# Patient Record
Sex: Male | Born: 1950 | Race: White | Hispanic: No | Marital: Single | Smoking: Current every day smoker
Health system: Southern US, Community
[De-identification: ages and names within clinical notes are randomized; demographics above are authoritative.]

## PROBLEM LIST (undated history)

## (undated) DIAGNOSIS — F0390 Unspecified dementia without behavioral disturbance: Secondary | ICD-10-CM

## (undated) DIAGNOSIS — I10 Essential (primary) hypertension: Secondary | ICD-10-CM

## (undated) DIAGNOSIS — M542 Cervicalgia: Secondary | ICD-10-CM

## (undated) DIAGNOSIS — Z7901 Long term (current) use of anticoagulants: Secondary | ICD-10-CM

## (undated) DIAGNOSIS — R972 Elevated prostate specific antigen [PSA]: Secondary | ICD-10-CM

## (undated) DIAGNOSIS — L0100 Impetigo, unspecified: Secondary | ICD-10-CM

## (undated) DIAGNOSIS — R6889 Other general symptoms and signs: Secondary | ICD-10-CM

## (undated) DIAGNOSIS — F32A Depression, unspecified: Secondary | ICD-10-CM

## (undated) HISTORY — DX: Unspecified dementia, unspecified severity, without behavioral disturbance, psychotic disturbance, mood disturbance, and anxiety: F03.90

## (undated) HISTORY — DX: Essential (primary) hypertension: I10

## (undated) HISTORY — DX: Long term (current) use of anticoagulants: Z79.01

## (undated) HISTORY — PX: HERNIA REPAIR: SHX51

---

## 2000-03-26 ENCOUNTER — Ambulatory Visit (HOSPITAL_COMMUNITY): Admission: RE | Admit: 2000-03-26 | Discharge: 2000-03-26 | Payer: Self-pay | Admitting: Emergency Medicine

## 2000-03-26 ENCOUNTER — Encounter: Payer: Self-pay | Admitting: Emergency Medicine

## 2001-04-10 ENCOUNTER — Encounter: Payer: Self-pay | Admitting: Orthopedic Surgery

## 2001-04-10 ENCOUNTER — Ambulatory Visit (HOSPITAL_COMMUNITY): Admission: RE | Admit: 2001-04-10 | Discharge: 2001-04-10 | Payer: Self-pay | Admitting: Orthopedic Surgery

## 2004-10-24 ENCOUNTER — Ambulatory Visit (HOSPITAL_COMMUNITY): Admission: RE | Admit: 2004-10-24 | Discharge: 2004-10-24 | Payer: Self-pay | Admitting: Surgery

## 2004-10-24 ENCOUNTER — Ambulatory Visit (HOSPITAL_BASED_OUTPATIENT_CLINIC_OR_DEPARTMENT_OTHER): Admission: RE | Admit: 2004-10-24 | Discharge: 2004-10-24 | Payer: Self-pay | Admitting: Surgery

## 2005-12-02 ENCOUNTER — Ambulatory Visit (HOSPITAL_COMMUNITY): Admission: RE | Admit: 2005-12-02 | Discharge: 2005-12-02 | Payer: Self-pay | Admitting: Orthopedic Surgery

## 2007-08-18 ENCOUNTER — Emergency Department (HOSPITAL_COMMUNITY): Admission: EM | Admit: 2007-08-18 | Discharge: 2007-08-18 | Payer: Self-pay | Admitting: Emergency Medicine

## 2008-03-16 ENCOUNTER — Ambulatory Visit (HOSPITAL_COMMUNITY): Admission: RE | Admit: 2008-03-16 | Discharge: 2008-03-16 | Payer: Self-pay | Admitting: Urology

## 2009-05-28 ENCOUNTER — Emergency Department (HOSPITAL_COMMUNITY): Admission: EM | Admit: 2009-05-28 | Discharge: 2009-05-28 | Payer: Self-pay | Admitting: Emergency Medicine

## 2010-06-17 NOTE — Op Note (Signed)
NAME:  Gabriel Wilson, Gabriel Wilson NO.:  [id number]   MEDICAL RECORD NO.:  [id number]          PATIENT TYPE:  AMB   LOCATION:  [facility]                         FACILITY:  [facility]   PHYSICIAN:  Thornton Park. Daphine Deutscher, MD  DATE OF BIRTH:  Sep 12, 1950   DATE OF PROCEDURE:  10/24/2004  DATE OF DISCHARGE:                                 OPERATIVE REPORT   PREOPERATIVE DIAGNOSIS:  Right inguinal hernia.   POSTOPERATIVE DIAGNOSIS:  Right indirect inguinal hernia.   PROCEDURE:  Right inguinal herniorrhaphy with Prolene hernia system mesh by  Ethicon.   SURGEON:  Thornton Park. Daphine Deutscher, MD   ANESTHESIA:  General.   DESCRIPTION OF PROCEDURE:  Mr. Goh was taken to room four at [facility]  and given general anesthesia. The abdomen was prepped widely with Betadine  including his scrotum, penis and the right inguinal region with Betadine,  draped sterilely. The skin was marked and a small oblique incision was made,  carried down to the external oblique which was incised along the fibers. I  mobilized the cord and put a Penrose around it. The cord was big and I went  ahead and went proximally and opened the cremasteric fibers longitudinally  and dissected free a fairly prominent indirect sac. I opened the sac, put my  finger in, the floor and felt adjacent lipoma coming up further weakening  the ring. The ring was about the size of a quarter and I went ahead and did  a high ligation sac with a 2-0 silk and produced that. I inserted some 4x4  gauze to help me dissect the preperitoneal space and then I inserted a  hernia patch plug by Ethicon trimming the inside portion a bit and tucking  it inside. Outside I opened along medially to allow it to go around the cord  structures and I sutured it to itself with a 2-0 Prolene and then tacked it  inferiorly to the inguinal ligament. It was tacked superiorly around the  ring with two sutures of 2-0 Prolene. It was then tucked beneath the  external oblique  and the external oblique was then closed over it with a  running 2-0 Vicryl. The area had been previously injected with 0.5%  Marcaine. No ilioinguinal nerve branches appeared to be transected and were  intact. The wound was then closed in layers with 4-0 Vicryl and with a  running subcuticular 5-0 Vicryl with Benzoin and Steri-Strips. The patient  was given a prescription for Tylox (#30) to take for pain. He will be  followed-up in the office in approximately 3 weeks.      Thornton Park Daphine Deutscher, MD  Electronically Signed     MBM/MEDQ  D:  10/24/2004  T:  10/24/2004  Job:  [id number]   cc:   Brett Canales A. Cleta Alberts, M.D.  Fax: [phone]

## 2011-03-22 ENCOUNTER — Ambulatory Visit: Payer: Self-pay | Admitting: Family Medicine

## 2011-03-22 VITALS — BP 182/98 | HR 80 | Temp 98.4°F | Resp 18 | Ht 74.0 in | Wt 245.0 lb

## 2011-03-22 DIAGNOSIS — I10 Essential (primary) hypertension: Secondary | ICD-10-CM

## 2011-03-22 DIAGNOSIS — R05 Cough: Secondary | ICD-10-CM

## 2011-03-22 DIAGNOSIS — R059 Cough, unspecified: Secondary | ICD-10-CM

## 2011-03-22 DIAGNOSIS — E669 Obesity, unspecified: Secondary | ICD-10-CM | POA: Insufficient documentation

## 2011-03-22 DIAGNOSIS — J029 Acute pharyngitis, unspecified: Secondary | ICD-10-CM

## 2011-03-22 MED ORDER — HYDROCODONE-ACETAMINOPHEN 5-500 MG PO TABS: 1.0000 | ORAL_TABLET | Freq: Three times a day (TID) | ORAL | Status: AC | PRN

## 2011-03-22 MED ORDER — HYDROCHLOROTHIAZIDE 25 MG PO TABS
25.0000 mg | ORAL_TABLET | Freq: Every day | ORAL | Status: DC
Start: 2011-03-22 — End: 2023-06-12

## 2011-03-22 MED ORDER — AZITHROMYCIN 250 MG PO TABS: ORAL_TABLET | ORAL | Status: AC

## 2011-03-22 NOTE — Progress Notes (Signed)
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 Low-grade temperature, chills, and aches.  Objective: Overweight middle-aged man in no acute distress  Patient is alert, cooperative and has no shortness of breath.  HEENT: Very red posterior pharynx, normal TMs.  Neck: No adenopathy, no thyromegaly, supple  Chest: Diffuse expiratory wheezes  Heart: Regular no murmur or gallop  Assessment: Bronchitis with pharyngitis  Plan: Z-Pak, Vicodin every 6 when necessary  Patient told to followup in 48 hours if not significantly better

## 2013-12-23 ENCOUNTER — Ambulatory Visit (INDEPENDENT_AMBULATORY_CARE_PROVIDER_SITE_OTHER): Payer: Self-pay | Admitting: Family Medicine

## 2013-12-23 VITALS — BP 148/88 | HR 70 | Temp 98.0°F | Resp 16 | Ht 73.0 in | Wt 221.4 lb

## 2013-12-23 DIAGNOSIS — M542 Cervicalgia: Secondary | ICD-10-CM

## 2013-12-23 DIAGNOSIS — L0100 Impetigo, unspecified: Secondary | ICD-10-CM

## 2013-12-23 DIAGNOSIS — S060X0A Concussion without loss of consciousness, initial encounter: Secondary | ICD-10-CM

## 2013-12-23 DIAGNOSIS — R6889 Other general symptoms and signs: Secondary | ICD-10-CM

## 2013-12-23 NOTE — Patient Instructions (Signed)
Concussion  A concussion, or closed-head injury, is a brain injury caused by a direct blow to the head or by a quick and sudden movement (jolt) of the head or neck. Concussions are usually not life-threatening. Even so, the effects of a concussion can be serious. If you have had a concussion before, you are more likely to experience concussion-like symptoms after a direct blow to the head.   CAUSES  · Direct blow to the head, such as from running into another player during a soccer game, being hit in a fight, or hitting your head on a hard surface.  · A jolt of the head or neck that causes the brain to move back and forth inside the skull, such as in a car crash.  SIGNS AND SYMPTOMS  The signs of a concussion can be hard to notice. Early on, they may be missed by you, family members, and health care providers. You may look fine but act or feel differently.  Symptoms are usually temporary, but they may last for days, weeks, or even longer. Some symptoms may appear right away while others may not show up for hours or days. Every head injury is different. Symptoms include:  · Mild to moderate headaches that will not go away.  · A feeling of pressure inside your head.  · Having more trouble than usual:  ¨ Learning or remembering things you have heard.  ¨ Answering questions.  ¨ Paying attention or concentrating.  ¨ Organizing daily tasks.  ¨ Making decisions and solving problems.  · Slowness in thinking, acting or reacting, speaking, or reading.  · Getting lost or being easily confused.  · Feeling tired all the time or lacking energy (fatigued).  · Feeling drowsy.  · Sleep disturbances.  ¨ Sleeping more than usual.  ¨ Sleeping less than usual.  ¨ Trouble falling asleep.  ¨ Trouble sleeping (insomnia).  · Loss of balance or feeling lightheaded or dizzy.  · Nausea or vomiting.  · Numbness or tingling.  · Increased sensitivity to:  ¨ Sounds.  ¨ Lights.  ¨ Distractions.  · Vision problems or eyes that tire  easily.  · Diminished sense of taste or smell.  · Ringing in the ears.  · Mood changes such as feeling sad or anxious.  · Becoming easily irritated or angry for little or no reason.  · Lack of motivation.  · Seeing or hearing things other people do not see or hear (hallucinations).  DIAGNOSIS  Your health care provider can usually diagnose a concussion based on a description of your injury and symptoms. He or she will ask whether you passed out (lost consciousness) and whether you are having trouble remembering events that happened right before and during your injury.  Your evaluation might include:  · A brain scan to look for signs of injury to the brain. Even if the test shows no injury, you may still have a concussion.  · Blood tests to be sure other problems are not present.  TREATMENT  · Concussions are usually treated in an emergency department, in urgent care, or at a clinic. You may need to stay in the hospital overnight for further treatment.  · Tell your health care provider if you are taking any medicines, including prescription medicines, over-the-counter medicines, and natural remedies. Some medicines, such as blood thinners (anticoagulants) and aspirin, may increase the chance of complications. Also tell your health care provider whether you have had alcohol or are taking illegal drugs. This information   may affect treatment.  · Your health care provider will send you home with important instructions to follow.  · How fast you will recover from a concussion depends on many factors. These factors include how severe your concussion is, what part of your brain was injured, your age, and how healthy you were before the concussion.  · Most people with mild injuries recover fully. Recovery can take time. In general, recovery is slower in older persons. Also, persons who have had a concussion in the past or have other medical problems may find that it takes longer to recover from their current injury.  HOME  CARE INSTRUCTIONS  General Instructions  · Carefully follow the directions your health care provider gave you.  · Only take over-the-counter or prescription medicines for pain, discomfort, or fever as directed by your health care provider.  · Take only those medicines that your health care provider has approved.  · Do not drink alcohol until your health care provider says you are well enough to do so. Alcohol and certain other drugs may slow your recovery and can put you at risk of further injury.  · If it is harder than usual to remember things, write them down.  · If you are easily distracted, try to do one thing at a time. For example, do not try to watch TV while fixing dinner.  · Talk with family members or close friends when making important decisions.  · Keep all follow-up appointments. Repeated evaluation of your symptoms is recommended for your recovery.  · Watch your symptoms and tell others to do the same. Complications sometimes occur after a concussion. Older adults with a brain injury may have a higher risk of serious complications, such as a blood clot on the brain.  · Tell your teachers, school nurse, school counselor, coach, athletic trainer, or work manager about your injury, symptoms, and restrictions. Tell them about what you can or cannot do. They should watch for:  ¨ Increased problems with attention or concentration.  ¨ Increased difficulty remembering or learning new information.  ¨ Increased time needed to complete tasks or assignments.  ¨ Increased irritability or decreased ability to cope with stress.  ¨ Increased symptoms.  · Rest. Rest helps the brain to heal. Make sure you:  ¨ Get plenty of sleep at night. Avoid staying up late at night.  ¨ Keep the same bedtime hours on weekends and weekdays.  ¨ Rest during the day. Take daytime naps or rest breaks when you feel tired.  · Limit activities that require a lot of thought or concentration. These include:  ¨ Doing homework or job-related  work.  ¨ Watching TV.  ¨ Working on the computer.  · Avoid any situation where there is potential for another head injury (football, hockey, soccer, basketball, martial arts, downhill snow sports and horseback riding). Your condition will get worse every time you experience a concussion. You should avoid these activities until you are evaluated by the appropriate follow-up health care providers.  Returning To Your Regular Activities  You will need to return to your normal activities slowly, not all at once. You must give your body and brain enough time for recovery.  · Do not return to sports or other athletic activities until your health care provider tells you it is safe to do so.  · Ask your health care provider when you can drive, ride a bicycle, or operate heavy machinery. Your ability to react may be slower after a   brain injury. Never do these activities if you are dizzy.  · Ask your health care provider about when you can return to work or school.  Preventing Another Concussion  It is very important to avoid another brain injury, especially before you have recovered. In rare cases, another injury can lead to permanent brain damage, brain swelling, or death. The risk of this is greatest during the first 7-10 days after a head injury. Avoid injuries by:  · Wearing a seat belt when riding in a car.  · Drinking alcohol only in moderation.  · Wearing a helmet when biking, skiing, skateboarding, skating, or doing similar activities.  · Avoiding activities that could lead to a second concussion, such as contact or recreational sports, until your health care provider says it is okay.  · Taking safety measures in your home.  ¨ Remove clutter and tripping hazards from floors and stairways.  ¨ Use grab bars in bathrooms and handrails by stairs.  ¨ Place non-slip mats on floors and in bathtubs.  ¨ Improve lighting in dim areas.  SEEK MEDICAL CARE IF:  · You have increased problems paying attention or  concentrating.  · You have increased difficulty remembering or learning new information.  · You need more time to complete tasks or assignments than before.  · You have increased irritability or decreased ability to cope with stress.  · You have more symptoms than before.  Seek medical care if you have any of the following symptoms for more than 2 weeks after your injury:  · Lasting (chronic) headaches.  · Dizziness or balance problems.  · Nausea.  · Vision problems.  · Increased sensitivity to noise or light.  · Depression or mood swings.  · Anxiety or irritability.  · Memory problems.  · Difficulty concentrating or paying attention.  · Sleep problems.  · Feeling tired all the time.  SEEK IMMEDIATE MEDICAL CARE IF:  · You have severe or worsening headaches. These may be a sign of a blood clot in the brain.  · You have weakness (even if only in one hand, leg, or part of the face).  · You have numbness.  · You have decreased coordination.  · You vomit repeatedly.  · You have increased sleepiness.  · One pupil is larger than the other.  · You have convulsions.  · You have slurred speech.  · You have increased confusion. This may be a sign of a blood clot in the brain.  · You have increased restlessness, agitation, or irritability.  · You are unable to recognize people or places.  · You have neck pain.  · It is difficult to wake you up.  · You have unusual behavior changes.  · You lose consciousness.  MAKE SURE YOU:  · Understand these instructions.  · Will watch your condition.  · Will get help right away if you are not doing well or get worse.  Document Released: 04/08/2003 Document Revised: 01/21/2013 Document Reviewed: 08/08/2012  ExitCare® Patient Information ©2015 [employer]. This information is not intended to replace advice given to you by your health care provider. Make sure you discuss any questions you have with your health care provider.

## 2013-12-23 NOTE — Progress Notes (Addendum)
Subjective:  This chart was scribed for Gabriel SimmerKristi Reford Olliff, MD by Evon Slackerrance Branch, ED Scribe. This Patient was seen in room 09 and the patients care was started at 5:02 PM   Patient ID: Gabriel Wilson, male    DOB: [birth year], 63 y.o.   MRN: [id number]  12/23/2013  Fall; Headache; Abrasion; and Memory Loss   Fall Associated symptoms include headaches and nausea. Pertinent negatives include no abdominal pain, fever, hematuria, numbness or vomiting.  Headache  Associated symptoms include nausea and neck pain. Pertinent negatives include no abdominal pain, dizziness, fever, numbness, rhinorrhea, seizures, vomiting or weakness.   HPI Comments: Gabriel Wilson is a 63 y.o. male who presents to the [facility] complaining of fall onset 3 weeks ago. He states that he was walking on a ramp and slipped on some leaves. He states that he fell backwards and hit the back of his head on the wooden ramp. He denies LOC. He states that he had associated headache for about 2 days after fall. He states that he has had some nausea and slight worsening of chronic neck pain. He states that he took a BC powder and that provided relief of his headache. Pt states that he feels paranoid that there are some gadgets in his neighborhood that will make his nerves hurt. He states that he remember the fall and everything that happened the day of the fall. He state that since the fall he has become more absent minded. He states that he loses simple things like his glasses and tools. He states that when he was driving to a friend's house and couldn't seem to remember which way to go even though he has been down the road several times before. Denies dizziness, vomiting, ear discharge,  blurred vision or double vision. Denies any wounds after the fall.    Pt states mother is deceased at age 61 from breast cancer. Father is deceased at age 61 from prostate cancer. Denies PCP. 1 sister with possible breast cancer  but is unsure. 1 brother who is a chronic smoker who breaths heavy but does not acknowledge having health problems.   He states that he is single and lives with his dog. States that he is a [employer]. He states that he is smoker of pure tobacco.    Pt states that he has a small infection under his right nose. That he applied OTC ointment to with some relief. He states that he also had an similar area to his left heel and applied the OTC ointment that provided relief.   Pt denies any urinary symptoms. He states that he notices that his urine color changes. Denies any hematuria. States that he may get up about once a night to urinate.   Review of Systems  Constitutional: Negative for fever, chills, diaphoresis, activity change, appetite change, fatigue and unexpected weight change.  HENT: Negative for congestion, ear discharge and rhinorrhea.   Eyes: Negative for visual disturbance.  Respiratory: Negative for shortness of breath.   Cardiovascular: Negative for chest pain.  Gastrointestinal: Positive for nausea. Negative for vomiting and abdominal pain.  Genitourinary: Negative for frequency and hematuria.  Musculoskeletal: Positive for neck pain and neck stiffness. Negative for arthralgias.  Skin: Positive for rash. Negative for wound.  Neurological: Positive for headaches. Negative for dizziness, tremors, seizures, syncope, facial asymmetry, speech difficulty, weakness, light-headedness and numbness.  Psychiatric/Behavioral: Negative for suicidal ideas, confusion, sleep disturbance, self-injury, dysphoric mood and decreased concentration. The patient is  not nervous/anxious.     Past Medical History  Diagnosis Date  . Hypertension    Past Surgical History  Procedure Laterality Date  . Hernia repair     Allergies  Allergen Reactions  . Celebrex [Celecoxib] Swelling   Current Outpatient Prescriptions  Medication Sig Dispense Refill  . aspirin 81 MG tablet Take 81 mg by mouth daily.      . hydrochlorothiazide (HYDRODIURIL) 25 MG tablet Take 1 tablet (25 mg total) by mouth daily. (Patient not taking: Reported on 12/23/2013) 90 tablet 3   No current facility-administered medications for this visit.       Objective:    BP 148/88 mmHg  Pulse 70  Temp(Src) 98 F (36.7 C) (Oral)  Resp 16  Ht 6\' 1"  (1.854 m)  Wt 221 lb 6.4 oz (100.426 kg)  BMI 29.22 kg/m2  SpO2 97%   Physical Exam  Constitutional: He is oriented to person, place, and time. He appears well-developed and well-nourished. No distress.  HENT:  Head: Normocephalic and atraumatic.    Right Ear: External ear normal.  Left Ear: External ear normal.  Mouth/Throat: Oropharynx is clear and moist. No oropharyngeal exudate.  8 mm pustular area under R nare.  No associated vesicles; no drainage.  Eyes: Conjunctivae and EOM are normal. Pupils are equal, round, and reactive to light.  Neck: Normal range of motion. Neck supple. No tracheal deviation present. No thyromegaly present.  Cardiovascular: Normal rate, regular rhythm, normal heart sounds and intact distal pulses.  Exam reveals no gallop and no friction rub.   No murmur heard. Pulmonary/Chest: Effort normal and breath sounds normal. No respiratory distress. He has no wheezes. He has no rales.  Abdominal: Soft. Bowel sounds are normal. He exhibits no distension and no mass. There is no tenderness. There is no rebound and no guarding.  Musculoskeletal: Normal range of motion.       Right shoulder: Normal. He exhibits normal range of motion, no tenderness and no bony tenderness.       Left shoulder: Normal. He exhibits normal range of motion, no tenderness and no bony tenderness.       Cervical back: He exhibits tenderness, pain and spasm. He exhibits normal range of motion, no bony tenderness and normal pulse.       Thoracic back: Normal. He exhibits normal range of motion, no tenderness and no bony tenderness.       Lumbar back: Normal. He exhibits normal  range of motion, no tenderness, no bony tenderness, no pain and no spasm.  Lymphadenopathy:    He has no cervical adenopathy.  Neurological: He is alert and oriented to person, place, and time. He has normal strength. No cranial nerve deficit or sensory deficit. He exhibits normal muscle tone. He displays a negative Romberg sign. Coordination and gait normal.  Skin: Skin is warm and dry. Rash noted. He is not diaphoretic.  8 mm pustule area at right nare  Psychiatric: He has a normal mood and affect. His behavior is normal. Judgment and thought content normal.  MMSE 28/30.  Nursing note and vitals reviewed.  No results found for this or any previous visit.     Assessment & Plan:   1. Concussion, without loss of consciousness, initial encounter   2. Neck pain   3. Forgetfulness   4. Impetigo      1. Concussion:  New.  Normal neurological exam; having some increase in forgetfulness. No loss of consciousness.  No associated headache, vomiting, dizziness.  Supportive care with rest, fluids. 2.  Neck pain/strain: New.  Chronic neck pain with recent worsening with fall; good range of motion of cervical spine; no midline tenderness; no radicular symptoms; pt declined xray.   3.  Forgetfulness:  New. Following head trauma; consistent with concussive syndrome; RTC for acute worsening. 4.  Impetigo R nare: New.  Recommend Neosporin to nare bid.    Meds ordered this encounter  Medications  . aspirin 81 MG tablet    Sig: Take 81 mg by mouth daily.    No Follow-up on file.    I personally performed the services described in this documentation, which was scribed in my presence. The recorded information has been reviewed and considered.  Gabriel Simmer, M.D.  [facility] [address] [place], [place]  [address] [phone] phone [phone] fax

## 2013-12-27 ENCOUNTER — Telehealth: Payer: Self-pay | Admitting: Family Medicine

## 2013-12-27 NOTE — Telephone Encounter (Signed)
Please call patient --- see how wound under nose is doing.

## 2013-12-29 NOTE — Telephone Encounter (Signed)
LM for rtn call. 

## 2014-01-01 NOTE — Telephone Encounter (Signed)
Left detailed message on VM to rtn call if any concerns with nose healing.

## 2014-07-03 ENCOUNTER — Ambulatory Visit (INDEPENDENT_AMBULATORY_CARE_PROVIDER_SITE_OTHER): Payer: Self-pay | Admitting: Emergency Medicine

## 2014-07-03 VITALS — BP 138/74 | HR 73 | Temp 97.8°F | Resp 16 | Ht 73.0 in | Wt 235.0 lb

## 2014-07-03 DIAGNOSIS — H9319 Tinnitus, unspecified ear: Secondary | ICD-10-CM

## 2014-07-03 DIAGNOSIS — R6889 Other general symptoms and signs: Secondary | ICD-10-CM

## 2014-07-03 NOTE — Progress Notes (Signed)
Subjective:  Patient ID: Gabriel Wilson, male    DOB: [birth year]  Age: 64 y.o. MRN: [id number]  CC: Ear Problem; Memory issues; and Non-stress Test   HPI Gabriel Wilson presents  with a concern for impaired short-term memory. He was seen back in November for similar concerns. He had a concussion back in November his senses had short-term memory issues. He also describes hearing a very high-pitched noise at night that prevents him from sleeping. [place] SpanielSaid it only occurs in his neighborhood. And he lives alone psychiatric and his neighbors are not friendly with him so he can't verify that the noise is actually something that is in the environment. He has no difficulty gait balance or coordination no weakness and no visual symptoms. No facial asymmetry or slurred speech. He has no headache.  He has no improvement in symptoms with over-the-counter medication.   Outpatient Prescriptions Prior to Visit  Medication Sig Dispense Refill  . aspirin 81 MG tablet Take 81 mg by mouth daily.    . hydrochlorothiazide (HYDRODIURIL) 25 MG tablet Take 1 tablet (25 mg total) by mouth daily. (Patient not taking: Reported on 12/23/2013) 90 tablet 3   No facility-administered medications prior to visit.    History   Social History  . Marital Status: Single    Spouse Name: N/A  . Number of Children: N/A  . Years of Education: N/A   Social History Main Topics  . Smoking status: Current Every Day Smoker -- 0.50 packs/day for 20 years    Types: Cigarettes  . Smokeless tobacco: Not on file  . Alcohol Use: 0.6 oz/week    1 Standard drinks or equivalent per week     Comment: former drinker/ now only occas. alcohol use  . Drug Use: No  . Sexual Activity: Not on file   Other Topics Concern  . None   Social History Narrative   Marital status:  Single      Lives: with dog      Children: none      Employment:  Landlord; 2 houses.      Tobacco: 1/2-1 ppd      Alcohol: 1 drink per day      Drugs:   None          Family History  Problem Relation Age of Onset  . Cancer Mother     breast cancer  . Cancer Father 1075    prostate cancer    Past Medical History  Diagnosis Date  . Hypertension      Review of Systems  Constitutional: Negative for fever, chills and appetite change.  HENT: Positive for tinnitus. Negative for congestion, ear pain, postnasal drip, sinus pressure and sore throat.   Eyes: Negative for pain and redness.  Respiratory: Negative for cough, shortness of breath and wheezing.   Cardiovascular: Negative for leg swelling.  Gastrointestinal: Negative for nausea, vomiting, abdominal pain, diarrhea, constipation and blood in stool.  Endocrine: Negative for polyuria.  Genitourinary: Negative for dysuria, urgency, frequency and flank pain.  Musculoskeletal: Negative for gait problem.  Skin: Negative for rash.  Neurological: Negative for weakness and headaches.  Psychiatric/Behavioral: Negative for confusion and decreased concentration. The patient is not nervous/anxious.     Objective:  BP 138/74 mmHg  Pulse 73  Temp(Src) 97.8 F (36.6 C) (Oral)  Resp 16  Ht 6\' 1"  (1.854 m)  Wt 235 lb (106.595 kg)  BMI 31.01 kg/m2  SpO2 97%  BP Readings from Last 3 Encounters:  07/03/14 138/74  12/23/13 148/88  03/22/11 182/98    Wt Readings from Last 3 Encounters:  07/03/14 235 lb (106.595 kg)  12/23/13 221 lb 6.4 oz (100.426 kg)  03/22/11 245 lb (111.131 kg)    Physical Exam  Constitutional: He is oriented to person, place, and time. He appears well-developed and well-nourished. No distress.  HENT:  Head: Normocephalic and atraumatic.  Right Ear: External ear normal.  Left Ear: External ear normal.  Nose: Nose normal.  Eyes: Conjunctivae and EOM are normal. Pupils are equal, round, and reactive to light. No scleral icterus.  Neck: Normal range of motion. Neck supple. No tracheal deviation present.  Cardiovascular: Normal rate, regular rhythm and normal  heart sounds.   Pulmonary/Chest: Effort normal. No respiratory distress. He has no wheezes. He has no rales.  Abdominal: He exhibits no mass. There is no tenderness. There is no rebound and no guarding.  Musculoskeletal: He exhibits no edema.  Lymphadenopathy:    He has no cervical adenopathy.  Neurological: He is alert and oriented to person, place, and time. Coordination normal.  Skin: Skin is warm and dry. No rash noted.  Psychiatric: He has a normal mood and affect. His behavior is normal.    No results found for: WBC, HGB, HCT, PLT, GLUCOSE, CHOL, TRIG, HDL, LDLDIRECT, LDLCALC, ALT, AST, NA, K, CL, CREATININE, BUN, CO2, TSH, PSA, INR, GLUF, HGBA1C, MICROALBUR    .  Assessment & Plan:   Julius was seen today for ear problem, memory issues and non-stress test.  Diagnoses and all orders for this visit:  Forgetfulness Orders: -     Ambulatory referral to Neurology   I am having Mr. Ketchum maintain his hydrochlorothiazide and aspirin.  No orders of the defined types were placed in this encounter.    He was referred to neurology and will follow up for new or worse symptoms. I believe this problem with a with a high-pitched noise tinnitus  Appropriate red flag conditions were discussed with the patient as well as actions that should be taken.  Patient expressed his understanding.  Follow-up: Return if symptoms worsen or fail to improve.  Carmelina Dane, MD

## 2014-07-03 NOTE — Patient Instructions (Signed)

## 2015-09-11 ENCOUNTER — Ambulatory Visit: Payer: Self-pay

## 2015-09-13 ENCOUNTER — Ambulatory Visit (INDEPENDENT_AMBULATORY_CARE_PROVIDER_SITE_OTHER): Payer: Self-pay | Admitting: Physician Assistant

## 2015-09-13 ENCOUNTER — Ambulatory Visit (INDEPENDENT_AMBULATORY_CARE_PROVIDER_SITE_OTHER): Payer: Self-pay

## 2015-09-13 VITALS — BP 140/98 | HR 83 | Temp 98.0°F | Resp 17 | Ht 73.0 in | Wt 225.0 lb

## 2015-09-13 DIAGNOSIS — M25512 Pain in left shoulder: Secondary | ICD-10-CM

## 2015-09-13 MED ORDER — CYCLOBENZAPRINE HCL 10 MG PO TABS: 10.0000 mg | ORAL_TABLET | Freq: Three times a day (TID) | ORAL | 0 refills | Status: DC | PRN

## 2015-09-13 MED ORDER — MELOXICAM 15 MG PO TABS: 7.5000 mg | ORAL_TABLET | Freq: Every day | ORAL | 0 refills | Status: DC

## 2015-09-13 NOTE — Patient Instructions (Addendum)
Please ice the shoulder three times per day for 15 minutes.  I would like you to perform these stretches.   I would also like you to use tylenol for the pain.  The flexeril try a 1/2 tablet to see if this is not sedating, and whole tablet at night.   Calcific Tendinitis Calcific tendinitis occurs when crystals of calcium are deposited in a tendon. Tendons are bands of strong, fibrous tissue that attach muscles to bones. Tendons are an important part of joints. They make the joint move and they absorb some of the stress that a joint receives during use. When calcium is deposited in the tendon, the tendon becomes stiff, painful, and it can become swollen. Calcific tendinitis occurs frequently in the shoulder joint, in a structure called the rotator cuff. CAUSES  The cause of calcific tendinitis is unclear. It may be associated with:  Overuse of the tendon, such as from repetitive motion.  Excess stress on the tendon.  Aging.  Repetitive, mild injuries. SYMPTOMS   Pain may or may not be present. If it is present, it may occur when moving the joint.  Tenderness when pressure is applied to the tendon.  A snapping or popping sound when the joint moves.  Decreased motion of the joint.  Difficulty sleeping due to pain in the joint. DIAGNOSIS  Your health care provider will perform a physical exam. Imaging tests may also be used to make the diagnosis. These may include X-rays, an MRI, or a CT scan. TREATMENT  Generally, calcific tendinitis resolves on its own. Treatment for pain of calcific tendinitis may include:  Taking over-the-counter medicines, such as anti-inflammatory drugs.  Applying ice packs to the joint.  Following a specific exercise program to keep the joint working properly.  Attending physical therapy sessions.  Avoiding activities that cause pain. Treatment for more severe calcific tendinitis may require:  Injecting cortisone steroids or pain relieving medicines into  or around the joint.  Manipulating the joint after you are given medicine to numb the area (local anesthetic).  Inflating the joint with sterile fluid to increase the flexibility of the tendons.  Shock wave therapy, which involves focusing sound waves on the joint. If other treatments do not work, surgery may be done to clean out the calcium deposits and repair the tendons where needed. Most people do not need surgery. HOME CARE INSTRUCTIONS   Only take over-the-counter or prescription medicines for pain, fever, or discomfort as directed by your health care provider.  Follow your health care provider's recommendations for activity and exercise. SEEK MEDICAL CARE IF:  You notice an increase in pain or numbness.  You develop new weakness.  You notice increased joint stiffness or a sensation of looseness in the joint.  You notice increasing redness, swelling, or warmth around the joint area. SEEK IMMEDIATE MEDICAL CARE IF:  You have a fever or persistent symptoms for more than 2 to 3 days.  You have a fever and your symptoms suddenly get worse. MAKE SURE YOU:  Understand these instructions.  Will watch your condition.  Will get help right away if you are not doing well or get worse.   This information is not intended to replace advice given to you by your health care provider. Make sure you discuss any questions you have with your health care provider.   Document Released: 10/26/2007 Document Revised: 10/07/2014 Document Reviewed: 04/27/2011 Elsevier Interactive Patient Education 2016 [employer]  Generic Shoulder Exercises EXERCISES  RANGE OF MOTION (ROM)  AND STRETCHING EXERCISES These exercises may help you when beginning to rehabilitate your injury. Your symptoms may resolve with or without further involvement from your physician, physical therapist or athletic trainer. While completing these exercises, remember:   Restoring tissue flexibility helps normal motion to  return to the joints. This allows healthier, less painful movement and activity.  An effective stretch should be held for at least 30 seconds.  A stretch should never be painful. You should only feel a gentle lengthening or release in the stretched tissue. ROM - Pendulum  Bend at the waist so that your right / left arm falls away from your body. Support yourself with your opposite hand on a solid surface, such as a table or a countertop.  Your right / left arm should be perpendicular to the ground. If it is not perpendicular, you need to lean over farther. Relax the muscles in your right / left arm and shoulder as much as possible.  Gently sway your hips and trunk so they move your right / left arm without any use of your right / left shoulder muscles.  Progress your movements so that your right / left arm moves side to side, then forward and backward, and finally, both clockwise and counterclockwise.  Complete __________ repetitions in each direction. Many people use this exercise to relieve discomfort in their shoulder as well as to gain range of motion. Repeat __________ times. Complete this exercise __________ times per day. STRETCH - Flexion, Standing  Stand with good posture. With an underhand grip on your right / left hand and an overhand grip on the opposite hand, grasp a broomstick or cane so that your hands are a little more than shoulder-width apart.  Keeping your right / left elbow straight and shoulder muscles relaxed, push the stick with your opposite hand to raise your right / left arm in front of your body and then overhead. Raise your arm until you feel a stretch in your right / left shoulder, but before you have increased shoulder pain.  Try to avoid shrugging your right / left shoulder as your arm rises by keeping your shoulder blade tucked down and toward your mid-back spine. Hold __________ seconds.  Slowly return to the starting position. Repeat __________ times.  Complete this exercise __________ times per day. STRETCH - Internal Rotation  Place your right / left hand behind your back, palm-up.  Throw a towel or belt over your opposite shoulder. Grasp the towel/belt with your right / left hand.  While keeping an upright posture, gently pull up on the towel/belt until you feel a stretch in the front of your right / left shoulder.  Avoid shrugging your right / left shoulder as your arm rises by keeping your shoulder blade tucked down and toward your mid-back spine.  Hold __________. Release the stretch by lowering your opposite hand. Repeat __________ times. Complete this exercise __________ times per day. STRETCH - External Rotation and Abduction  Stagger your stance through a doorframe. It does not matter which foot is forward.  As instructed by your physician, physical therapist or athletic trainer, place your hands:  And forearms above your head and on the door frame.  And forearms at head-height and on the door frame.  At elbow-height and on the door frame.  Keeping your head and chest upright and your stomach muscles tight to prevent over-extending your low-back, slowly shift your weight onto your front foot until you feel a stretch across your chest and/or in  the front of your shoulders.  Hold __________ seconds. Shift your weight to your back foot to release the stretch. Repeat __________ times. Complete this stretch __________ times per day.  STRENGTHENING EXERCISES  These exercises may help you when beginning to rehabilitate your injury. They may resolve your symptoms with or without further involvement from your physician, physical therapist or athletic trainer. While completing these exercises, remember:   Muscles can gain both the endurance and the strength needed for everyday activities through controlled exercises.  Complete these exercises as instructed by your physician, physical therapist or athletic trainer. Progress the  resistance and repetitions only as guided.  You may experience muscle soreness or fatigue, but the pain or discomfort you are trying to eliminate should never worsen during these exercises. If this pain does worsen, stop and make certain you are following the directions exactly. If the pain is still present after adjustments, discontinue the exercise until you can discuss the trouble with your clinician.  If advised by your physician, during your recovery, avoid activity or exercises which involve actions that place your right / left hand or elbow above your head or behind your back or head. These positions stress the tissues which are trying to heal. STRENGTH - Scapular Depression and Adduction  With good posture, sit on a firm chair. Supported your arms in front of you with pillows, arm rests or a table top. Have your elbows in line with the sides of your body.  Gently draw your shoulder blades down and toward your mid-back spine. Gradually increase the tension without tensing the muscles along the top of your shoulders and the back of your neck.  Hold for __________ seconds. Slowly release the tension and relax your muscles completely before completing the next repetition.  After you have practiced this exercise, remove the arm support and complete it in standing as well as sitting. Repeat __________ times. Complete this exercise __________ times per day.  STRENGTH - External Rotators  Secure a rubber exercise band/tubing to a fixed object so that it is at the same height as your right / left elbow when you are standing or sitting on a firm surface.  Stand or sit so that the secured exercise band/tubing is at your side that is not injured.  Bend your elbow 90 degrees. Place a folded towel or small pillow under your right / left arm so that your elbow is a few inches away from your side.  Keeping the tension on the exercise band/tubing, pull it away from your body, as if pivoting on your  elbow. Be sure to keep your body steady so that the movement is only coming from your shoulder rotating.  Hold __________ seconds. Release the tension in a controlled manner as you return to the starting position. Repeat __________ times. Complete this exercise __________ times per day.  STRENGTH - Supraspinatus  Stand or sit with good posture. Grasp a __________ weight or an exercise band/tubing so that your hand is "thumbs-up," like when you shake hands.  Slowly lift your right / left hand from your thigh into the air, traveling about 30 degrees from straight out at your side. Lift your hand to shoulder height or as far as you can without increasing any shoulder pain. Initially, many people do not lift their hands above shoulder height.  Avoid shrugging your right / left shoulder as your arm rises by keeping your shoulder blade tucked down and toward your mid-back spine.  Hold for __________ seconds.  Control the descent of your hand as you slowly return to your starting position. Repeat __________ times. Complete this exercise __________ times per day.  STRENGTH - Shoulder Extensors  Secure a rubber exercise band/tubing so that it is at the height of your shoulders when you are either standing or sitting on a firm arm-less chair.  With a thumbs-up grip, grasp an end of the band/tubing in each hand. Straighten your elbows and lift your hands straight in front of you at shoulder height. Step back away from the secured end of band/tubing until it becomes tense.  Squeezing your shoulder blades together, pull your hands down to the sides of your thighs. Do not allow your hands to go behind you.  Hold for __________ seconds. Slowly ease the tension on the band/tubing as you reverse the directions and return to the starting position. Repeat __________ times. Complete this exercise __________ times per day.  STRENGTH - Scapular Retractors  Secure a rubber exercise band/tubing so that it is at the  height of your shoulders when you are either standing or sitting on a firm arm-less chair.  With a palm-down grip, grasp an end of the band/tubing in each hand. Straighten your elbows and lift your hands straight in front of you at shoulder height. Step back away from the secured end of band/tubing until it becomes tense.  Squeezing your shoulder blades together, draw your elbows back as you bend them. Keep your upper arm lifted away from your body throughout the exercise.  Hold __________ seconds. Slowly ease the tension on the band/tubing as you reverse the directions and return to the starting position. Repeat __________ times. Complete this exercise __________ times per day. STRENGTH - Scapular Depressors  Find a sturdy chair without wheels, such as a from a dining room table.  Keeping your feet on the floor, lift your bottom from the seat and lock your elbows.  Keeping your elbows straight, allow gravity to pull your body weight down. Your shoulders will rise toward your ears.  Raise your body against gravity by drawing your shoulder blades down your back, shortening the distance between your shoulders and ears. Although your feet should always maintain contact with the floor, your feet should progressively support less body weight as you get stronger.  Hold __________ seconds. In a controlled and slow manner, lower your body weight to begin the next repetition. Repeat __________ times. Complete this exercise __________ times per day.    This information is not intended to replace advice given to you by your health care provider. Make sure you discuss any questions you have with your health care provider.   Document Released: 11/30/2004 Document Revised: 02/06/2014 Document Reviewed: 04/30/2008 Elsevier Interactive Patient Education 2016 [employer].   IF you received an x-ray today, you will receive an invoice from [facility]. Please contact [facility] at  [phone] with questions or concerns regarding your invoice.   IF you received labwork today, you will receive an invoice from [employer] [facility]. Please contact [facility] at [phone] with questions or concerns regarding your invoice.   Our billing staff will not be able to assist you with questions regarding bills from these companies.  You will be contacted with the lab results as soon as they are available. The fastest way to get your results is to activate your My Chart account. Instructions are located on the last page of this paperwork. If you have not heard from [place] regarding the results in 2 weeks,  please contact this office.
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
## 2015-11-07 ENCOUNTER — Other Ambulatory Visit: Payer: Self-pay | Admitting: Physician Assistant

## 2015-11-07 DIAGNOSIS — M25512 Pain in left shoulder: Secondary | ICD-10-CM

## 2016-03-22 ENCOUNTER — Ambulatory Visit (INDEPENDENT_AMBULATORY_CARE_PROVIDER_SITE_OTHER): Payer: Self-pay

## 2016-03-22 ENCOUNTER — Ambulatory Visit (INDEPENDENT_AMBULATORY_CARE_PROVIDER_SITE_OTHER): Payer: Self-pay | Admitting: Family Medicine

## 2016-03-22 VITALS — BP 132/80 | HR 72 | Temp 98.1°F | Resp 18 | Ht 73.0 in | Wt 217.0 lb

## 2016-03-22 DIAGNOSIS — S86912A Strain of unspecified muscle(s) and tendon(s) at lower leg level, left leg, initial encounter: Secondary | ICD-10-CM

## 2016-03-22 MED ORDER — MELOXICAM 15 MG PO TABS: 15.0000 mg | ORAL_TABLET | Freq: Every day | ORAL | 1 refills | Status: DC

## 2016-03-22 NOTE — Patient Instructions (Addendum)
Start Meloxicam 7.5 twice daily for knee pain for 10 days. Then take only as needed.  Alternate ice and heat application to reduce inflammation of knee.  X-ray shows arthritis of left knee.   Return for care if symptoms worsen or do not improve.  IF you received an x-ray today, you will receive an invoice from [facility]. Please contact [facility] at [phone] with questions or concerns regarding your invoice.   IF you received labwork today, you will receive an invoice from [facility]. Please contact [facility] at [phone] with questions or concerns regarding your invoice.   Our billing staff will not be able to assist you with questions regarding bills from these companies.  You will be contacted with the lab results as soon as they are available. The fastest way to get your results is to activate your My Chart account. Instructions are located on the last page of this paperwork. If you have not heard from us regarding the results in 2 weeks, please contact this office.     Knee Pain Knee pain is a very common symptom and can have many causes. Knee pain often goes away when you follow your health care provider's instructions for relieving pain and discomfort at home. However, knee pain can develop into a condition that needs treatment. Some conditions may include:  Arthritis caused by wear and tear (osteoarthritis).  Arthritis caused by swelling and irritation (rheumatoid arthritis or gout).  A cyst or growth in your knee.  An infection in your knee joint.  An injury that will not heal.  Damage, swelling, or irritation of the tissues that support your knee (torn ligaments or tendinitis). If your knee pain continues, additional tests may be ordered to diagnose your condition. Tests may include X-rays or other imaging studies of your knee. You may also need to have fluid removed from your knee. Treatment for ongoing knee pain depends on the cause, but treatment  may include:  Medicines to relieve pain or swelling.  Steroid injections in your knee.  Physical therapy.  Surgery. HOME CARE INSTRUCTIONS  Take medicines only as directed by your health care provider.  Rest your knee and keep it raised (elevated) while you are resting.  Do not do things that cause or worsen pain.  Avoid high-impact activities or exercises, such as running, jumping rope, or doing jumping jacks.  Apply ice to the knee area:  Put ice in a plastic bag.  Place a towel between your skin and the bag.  Leave the ice on for 20 minutes, 2-3 times a day.  Ask your health care provider if you should wear an elastic knee support.  Keep a pillow under your knee when you sleep.  Lose weight if you are overweight. Extra weight can put pressure on your knee.  Do not use any tobacco products, including cigarettes, chewing tobacco, or electronic cigarettes. If you need help quitting, ask your health care provider. Smoking may slow the healing of any bone and joint problems that you may have. SEEK MEDICAL CARE IF:  Your knee pain continues, changes, or gets worse.  You have a fever along with knee pain.  Your knee buckles or locks up.  Your knee becomes more swollen. SEEK IMMEDIATE MEDICAL CARE IF:   Your knee joint feels hot to the touch.  You have chest pain or trouble breathing. This information is not intended to replace advice given to you by your health care provider. Make sure you discuss any questions you  have with your health care provider. Document Released: 11/13/2006 Document Revised: 02/06/2014 Document Reviewed: 09/01/2013 Elsevier Interactive Patient Education  2017 [employer].
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
## 2021-05-19 ENCOUNTER — Emergency Department (HOSPITAL_COMMUNITY)
Admission: EM | Admit: 2021-05-19 | Discharge: 2021-05-19 | Disposition: A | Discharge status: Home / Self Care | Payer: Medicare HMO | Attending: Emergency Medicine | Admitting: Emergency Medicine

## 2021-05-19 ENCOUNTER — Other Ambulatory Visit: Payer: Self-pay

## 2021-05-19 ENCOUNTER — Emergency Department (HOSPITAL_COMMUNITY): Payer: Medicare HMO

## 2021-05-19 DIAGNOSIS — I69911 Memory deficit following unspecified cerebrovascular disease: Secondary | ICD-10-CM | POA: Insufficient documentation

## 2021-05-19 DIAGNOSIS — Z7901 Long term (current) use of anticoagulants: Secondary | ICD-10-CM | POA: Diagnosis not present

## 2021-05-19 DIAGNOSIS — I4891 Unspecified atrial fibrillation: Secondary | ICD-10-CM | POA: Insufficient documentation

## 2021-05-19 DIAGNOSIS — Z7982 Long term (current) use of aspirin: Secondary | ICD-10-CM | POA: Insufficient documentation

## 2021-05-19 DIAGNOSIS — R413 Other amnesia: Secondary | ICD-10-CM

## 2021-05-19 DIAGNOSIS — R4182 Altered mental status, unspecified: Secondary | ICD-10-CM | POA: Diagnosis present

## 2021-05-19 DIAGNOSIS — Z79899 Other long term (current) drug therapy: Secondary | ICD-10-CM | POA: Insufficient documentation

## 2021-05-19 DIAGNOSIS — I10 Essential (primary) hypertension: Secondary | ICD-10-CM | POA: Insufficient documentation

## 2021-05-19 LAB — COMPREHENSIVE METABOLIC PANEL
ALT: 8 U/L (ref 0–44)
AST: 16 U/L (ref 15–41)
Albumin: 4 g/dL (ref 3.5–5.0)
Alkaline Phosphatase: 56 U/L (ref 38–126)
Anion gap: 7 (ref 5–15)
BUN: 22 mg/dL (ref 8–23)
CO2: 27 mmol/L (ref 22–32)
Calcium: 9 mg/dL (ref 8.9–10.3)
Chloride: 102 mmol/L (ref 98–111)
Creatinine, Ser: 1.21 mg/dL (ref 0.61–1.24)
GFR, Estimated: >60 mL/min (ref >60)
Glucose, Bld: 92 mg/dL (ref 70–99)
Potassium: 3.7 mmol/L (ref 3.5–5.1)
Sodium: 136 mmol/L (ref 135–145)
Total Bilirubin: 0.3 mg/dL (ref 0.3–1.2)
Total Protein: 6.8 g/dL (ref 6.5–8.1)

## 2021-05-19 LAB — CBC
HCT: 35.8 % — ABNORMAL LOW (ref 39.0–52.0)
Hemoglobin: 12 g/dL — ABNORMAL LOW (ref 13.0–17.0)
MCH: 31.7 pg (ref 26.0–34.0)
MCHC: 33.5 g/dL (ref 30.0–36.0)
MCV: 94.5 fL (ref 80.0–100.0)
Platelets: 210 10*3/uL (ref 150–400)
RBC: 3.79 MIL/uL — ABNORMAL LOW (ref 4.22–5.81)
RDW: 14.6 % (ref 11.5–15.5)
WBC: 7.2 10*3/uL (ref 4.0–10.5)
nRBC: 0 % (ref 0.0–0.2)

## 2021-05-19 LAB — URINALYSIS, ROUTINE W REFLEX MICROSCOPIC
Bilirubin Urine: NEGATIVE
Glucose, UA: NEGATIVE mg/dL
Hgb urine dipstick: NEGATIVE
Ketones, ur: NEGATIVE mg/dL
Leukocytes,Ua: NEGATIVE
Nitrite: NEGATIVE
Protein, ur: NEGATIVE mg/dL
Specific Gravity, Urine: 1.005 (ref 1.005–1.030)
pH: 5 (ref 5.0–8.0)

## 2021-05-19 LAB — ETHANOL: Alcohol, Ethyl (B): <10 mg/dL (ref <10)
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
## 2021-05-19 NOTE — ED Notes (Signed)
Discharge instructions reviewed, questions answered. Circled and underlined follow up information clearly on discharge paperwork and stressed importance of following up to patient several times prior to discharge. Pt is still slightly confused as to some events of today, but is oriented to place, self and able to state who the president is. Pt states understanding and no further questions. Pt ambulatory with steady gait upon discharge. No s/s of distress noted. Bus pass provided. ? ?With pt's permission, I called his sister to update her. No answer, but left a voicemail stressing importance of following up regarding memory issues and provided call back number for any questions or concerns. ?

## 2021-05-19 NOTE — Discharge Instructions (Signed)
Please follow-up with your primary care provider.  If you do not have a primary care provider please follow-up with the [facility] I have given you the information, phone number, address. ?
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
## 2021-12-30 ENCOUNTER — Telehealth: Payer: Self-pay | Admitting: Genetic Counselor

## 2021-12-30 NOTE — Telephone Encounter (Signed)
Called patient to schedule per 11/29 in basket. No voicemail set up. Mailing patient reminder

## 2022-02-13 ENCOUNTER — Inpatient Hospital Stay: Payer: Medicare HMO

## 2022-02-13 ENCOUNTER — Inpatient Hospital Stay: Payer: Medicare HMO | Admitting: Genetic Counselor

## 2022-02-13 ENCOUNTER — Telehealth: Payer: Self-pay | Admitting: Physician Assistant

## 2022-02-13 NOTE — Telephone Encounter (Signed)
Attempted to call patient to inform of appointments. Phone number is disconnected. Will continue to call patient

## 2022-02-13 NOTE — Progress Notes (Deleted)
[facility] [phone]   Fax:([phone] [place] NOTE  Patient Care Team: Patient, No Pcp Per as PCP - General (General Practice)  Hematological/Oncological History Labs from PCP, Dr. Sandi Mariscal: 11/09/2020: WBC 6.8, Hgb 15.2, MCV 89, Plt 192. 06/16/2021: WBC 7.8, Hg 12.6, MCV 98, Plt 235.  10/10/2021: WBC 7.3, Hgb 12.3 (L), MCV 97, Pl 267,  12/10/2021: WBC 6.8, Hgb 11. 9 (L), MCV 99, Plt 200, Ferritin 127, Folate 20.47, Iron 74, TIBC 367 (H), Vitamin B12 393.   2. 02/14/2022: Establish care with [facility]  CHIEF COMPLAINTS/PURPOSE OF CONSULTATION:  Normocytic anemia  HISTORY OF PRESENTING ILLNESS:  Gabriel Wilson 72 y.o. male with medical history significant for ***  On review of the previous records ***  On exam today ***  MEDICAL HISTORY:  Past Medical History:  Diagnosis Date   Hypertension     SURGICAL HISTORY: Past Surgical History:  Procedure Laterality Date   HERNIA REPAIR      SOCIAL HISTORY: Social History   Socioeconomic History   Marital status: Single    Spouse name: Not on file   Number of children: Not on file   Years of education: Not on file   Highest education level: Not on file  Occupational History   Not on file  Tobacco Use   Smoking status: Every Day    Packs/day: 0.50    Years: 20.00    Total pack years: 10.00    Types: Cigarettes   Smokeless tobacco: Current  Substance and Sexual Activity   Alcohol use: Yes    Alcohol/week: 1.0 standard drink of alcohol    Types: 1 Standard drinks or equivalent per week    Comment: former drinker/ now only occas. alcohol use   Drug use: No   Sexual activity: Not on file  Other Topics Concern   Not on file  Social History Narrative   Marital status:  Single      Lives: with dog      Children: none      Employment:  Landlord; 2 houses.      Tobacco: 1/2-1 ppd      Alcohol: 1 drink per day      Drugs:  None         Social Determinants of  Health   Financial Resource Strain: Not on file  Food Insecurity: Not on file  Transportation Needs: Not on file  Physical Activity: Not on file  Stress: Not on file  Social Connections: Not on file  Intimate Partner Violence: Not on file    FAMILY HISTORY: Family History  Problem Relation Age of Onset   Cancer Mother        breast cancer   Cancer Father 52       prostate cancer    ALLERGIES:  is allergic to celebrex [celecoxib].  MEDICATIONS:  Current Outpatient Medications  Medication Sig Dispense Refill   aspirin 81 MG tablet Take 81 mg by mouth daily.     cyclobenzaprine (FLEXERIL) 10 MG tablet Take 1 tablet (10 mg total) by mouth 3 (three) times daily as needed for muscle spasms. (Patient not taking: Reported on 03/22/2016) 30 tablet 0   diltiazem (CARDIZEM) 90 MG tablet Take 90 mg by mouth 2 (two) times daily.     ELIQUIS 5 MG TABS tablet Take 5 mg by mouth 2 (two) times daily.     hydrochlorothiazide (HYDRODIURIL) 25 MG tablet Take 1 tablet (25 mg total) by mouth  daily. (Patient not taking: Reported on 12/23/2013) 90 tablet 3   lisinopril-hydrochlorothiazide (ZESTORETIC) 20-12.5 MG tablet Take 1 tablet by mouth daily.     meloxicam (MOBIC) 15 MG tablet Take 1 tablet (15 mg total) by mouth daily. 30 tablet 1   No current facility-administered medications for this visit.    REVIEW OF SYSTEMS:   Constitutional: ( - ) fevers, ( - )  chills , ( - ) night sweats Eyes: ( - ) blurriness of vision, ( - ) double vision, ( - ) watery eyes Ears, nose, mouth, throat, and face: ( - ) mucositis, ( - ) sore throat Respiratory: ( - ) cough, ( - ) dyspnea, ( - ) wheezes Cardiovascular: ( - ) palpitation, ( - ) chest discomfort, ( - ) lower extremity swelling Gastrointestinal:  ( - ) nausea, ( - ) heartburn, ( - ) change in bowel habits Skin: ( - ) abnormal skin rashes Lymphatics: ( - ) new lymphadenopathy, ( - ) easy bruising Neurological: ( - ) numbness, ( - ) tingling, ( - ) new  weaknesses Behavioral/Psych: ( - ) mood change, ( - ) new changes  All other systems were reviewed with the patient and are negative.  PHYSICAL EXAMINATION: ECOG PERFORMANCE STATUS: {CHL ONC ECOG PS:[id number]}  There were no vitals filed for this visit. There were no vitals filed for this visit.  GENERAL: well appearing *** in NAD  SKIN: skin color, texture, turgor are normal, no rashes or significant lesions EYES: conjunctiva are pink and non-injected, sclera clear OROPHARYNX: no exudate, no erythema; lips, buccal mucosa, and tongue normal  NECK: supple, non-tender LYMPH:  no palpable lymphadenopathy in the cervical, axillary or supraclavicular lymph nodes.  LUNGS: clear to auscultation and percussion with normal breathing effort HEART: regular rate & rhythm and no murmurs and no lower extremity edema ABDOMEN: soft, non-tender, non-distended, normal bowel sounds Musculoskeletal: no cyanosis of digits and no clubbing  PSYCH: alert & oriented x 3, fluent speech NEURO: no focal motor/sensory deficits  LABORATORY DATA:  I have reviewed the data as listed    Latest Ref Rng & Units 05/19/2021    4:49 PM  CBC  WBC 4.0 - 10.5 K/uL 7.2   Hemoglobin 13.0 - 17.0 g/dL 12.0   Hematocrit 39.0 - 52.0 % 35.8   Platelets 150 - 400 K/uL 210        Latest Ref Rng & Units 05/19/2021    4:49 PM  CMP  Glucose 70 - 99 mg/dL 92   BUN 8 - 23 mg/dL 22   Creatinine 0.61 - 1.24 mg/dL 1.21   Sodium 135 - 145 mmol/L 136   Potassium 3.5 - 5.1 mmol/L 3.7   Chloride 98 - 111 mmol/L 102   CO2 22 - 32 mmol/L 27   Calcium 8.9 - 10.3 mg/dL 9.0   Total Protein 6.5 - 8.1 g/dL 6.8   Total Bilirubin 0.3 - 1.2 mg/dL 0.3   Alkaline Phos 38 - 126 U/L 56   AST 15 - 41 U/L 16   ALT 0 - 44 U/L 8      PATHOLOGY: ***  BLOOD FILM: *** Review of the peripheral blood smear showed normal appearing white cells with neutrophils that were appropriately lobated and granulated. There was no predominance of bi-lobed  or hyper-segmented neutrophils appreciated. No Dohle bodies were noted. There was no left shifting, immature forms or blasts noted. Lymphocytes remain normal in size without any predominance of large granular lymphocytes. Red  cells show no anisopoikilocytosis, macrocytes , microcytes or polychromasia. There were no schistocytes, target cells, echinocytes, acanthocytes, dacrocytes, or stomatocytes.There was no rouleaux formation, nucleated red cells, or intra-cellular inclusions noted. The platelets are normal in size, shape, and color without any clumping evident.  RADIOGRAPHIC STUDIES: I have personally reviewed the radiological images as listed and agreed with the findings in the report. No results found.  ASSESSMENT & PLAN ***  No orders of the defined types were placed in this encounter.   All questions were answered. The patient knows to call the clinic with any problems, questions or concerns.  I have spent a total of {CHL ONC TIME VISIT - [id number] minutes of face-to-face and non-face-to-face time, preparing to see the patient, obtaining and/or reviewing separately obtained history, performing a medically appropriate examination, counseling and educating the patient, ordering medications/tests/procedures, referring and communicating with other health care professionals, documenting clinical information in the electronic health record, independently interpreting results and communicating results to the patient, and care coordination.   Dede Query, PA-C Department of Hematology/Oncology [facility] Phone: [phone]

## 2022-02-14 ENCOUNTER — Telehealth: Payer: Self-pay

## 2022-02-14 ENCOUNTER — Inpatient Hospital Stay: Payer: Medicare HMO | Attending: Physician Assistant | Admitting: Physician Assistant

## 2022-02-14 ENCOUNTER — Inpatient Hospital Stay: Payer: Medicare HMO

## 2022-02-14 NOTE — Telephone Encounter (Signed)
Attempted to call patient on primary number regarding today's appointment. No answer. Unable to leave voicemail d/t number being out of service.  Dede Query, PA-C made aware.

## 2023-06-11 ENCOUNTER — Emergency Department (HOSPITAL_COMMUNITY)
Admission: EM | Admit: 2023-06-11 | Discharge: 2023-06-12 | Disposition: A | Discharge status: Home / Self Care | Attending: Emergency Medicine | Admitting: Emergency Medicine

## 2023-06-11 ENCOUNTER — Emergency Department (HOSPITAL_COMMUNITY)

## 2023-06-11 DIAGNOSIS — R001 Bradycardia, unspecified: Secondary | ICD-10-CM | POA: Insufficient documentation

## 2023-06-11 DIAGNOSIS — Z046 Encounter for general psychiatric examination, requested by authority: Secondary | ICD-10-CM

## 2023-06-11 DIAGNOSIS — Z79899 Other long term (current) drug therapy: Secondary | ICD-10-CM | POA: Diagnosis not present

## 2023-06-11 DIAGNOSIS — I10 Essential (primary) hypertension: Secondary | ICD-10-CM | POA: Diagnosis not present

## 2023-06-11 DIAGNOSIS — F22 Delusional disorders: Secondary | ICD-10-CM | POA: Insufficient documentation

## 2023-06-11 DIAGNOSIS — F0390 Unspecified dementia without behavioral disturbance: Secondary | ICD-10-CM | POA: Insufficient documentation

## 2023-06-11 DIAGNOSIS — R739 Hyperglycemia, unspecified: Secondary | ICD-10-CM | POA: Insufficient documentation

## 2023-06-11 DIAGNOSIS — D649 Anemia, unspecified: Secondary | ICD-10-CM | POA: Insufficient documentation

## 2023-06-11 DIAGNOSIS — R944 Abnormal results of kidney function studies: Secondary | ICD-10-CM | POA: Insufficient documentation

## 2023-06-11 DIAGNOSIS — Z7982 Long term (current) use of aspirin: Secondary | ICD-10-CM | POA: Insufficient documentation

## 2023-06-11 LAB — URINALYSIS, W/ REFLEX TO CULTURE (INFECTION SUSPECTED)
Bacteria, UA: NONE SEEN
Bilirubin Urine: NEGATIVE
Glucose, UA: NEGATIVE mg/dL
Hgb urine dipstick: NEGATIVE
Ketones, ur: NEGATIVE mg/dL
Leukocytes,Ua: NEGATIVE
Nitrite: NEGATIVE
Protein, ur: NEGATIVE mg/dL
Specific Gravity, Urine: 1.023 (ref 1.005–1.030)
pH: 5 (ref 5.0–8.0)

## 2023-06-11 LAB — CBC WITH DIFFERENTIAL/PLATELET
Abs Immature Granulocytes: 0.03 10*3/uL (ref 0.00–0.07)
Basophils Absolute: 0 10*3/uL (ref 0.0–0.1)
Basophils Relative: 1 %
Eosinophils Absolute: 0.3 10*3/uL (ref 0.0–0.5)
Eosinophils Relative: 5 %
HCT: 36.7 % — ABNORMAL LOW (ref 39.0–52.0)
Hemoglobin: 11.7 g/dL — ABNORMAL LOW (ref 13.0–17.0)
Immature Granulocytes: 1 %
Lymphocytes Relative: 15 %
Lymphs Abs: 0.9 10*3/uL (ref 0.7–4.0)
MCH: 30.9 pg (ref 26.0–34.0)
MCHC: 31.9 g/dL (ref 30.0–36.0)
MCV: 96.8 fL (ref 80.0–100.0)
Monocytes Absolute: 0.4 10*3/uL (ref 0.1–1.0)
Monocytes Relative: 6 %
Neutro Abs: 4.7 10*3/uL (ref 1.7–7.7)
Neutrophils Relative %: 72 %
Platelets: 167 10*3/uL (ref 150–400)
RBC: 3.79 MIL/uL — ABNORMAL LOW (ref 4.22–5.81)
RDW: 13 % (ref 11.5–15.5)
WBC: 6.4 10*3/uL (ref 4.0–10.5)
nRBC: 0 % (ref 0.0–0.2)

## 2023-06-11 LAB — COMPREHENSIVE METABOLIC PANEL WITH GFR
ALT: 12 U/L (ref 0–44)
AST: 17 U/L (ref 15–41)
Albumin: 3.7 g/dL (ref 3.5–5.0)
Alkaline Phosphatase: 52 U/L (ref 38–126)
Anion gap: 8 (ref 5–15)
BUN: 27 mg/dL — ABNORMAL HIGH (ref 8–23)
CO2: 27 mmol/L (ref 22–32)
Calcium: 9.5 mg/dL (ref 8.9–10.3)
Chloride: 106 mmol/L (ref 98–111)
Creatinine, Ser: 1.19 mg/dL (ref 0.61–1.24)
GFR, Estimated: >60 mL/min (ref >60)
Glucose, Bld: 154 mg/dL — ABNORMAL HIGH (ref 70–99)
Potassium: 4.8 mmol/L (ref 3.5–5.1)
Sodium: 141 mmol/L (ref 135–145)
Total Bilirubin: 0.6 mg/dL (ref 0.0–1.2)
Total Protein: 6.4 g/dL — ABNORMAL LOW (ref 6.5–8.1)

## 2023-06-11 LAB — RAPID URINE DRUG SCREEN, HOSP PERFORMED
Amphetamines: NOT DETECTED
Barbiturates: NOT DETECTED
Benzodiazepines: NOT DETECTED
Cocaine: NOT DETECTED
Opiates: NOT DETECTED
Tetrahydrocannabinol: NOT DETECTED

## 2023-06-11 LAB — ETHANOL: Alcohol, Ethyl (B): <15 mg/dL (ref <15)

## 2023-06-11 MED ORDER — QUETIAPINE FUMARATE 25 MG PO TABS
12.5000 mg | ORAL_TABLET | Freq: Two times a day (BID) | ORAL | Status: DC
  Administered 2023-06-11 – 2023-06-12 (×2): 12.5 mg via ORAL
  Filled 2023-06-11 (×2): qty 1

## 2023-06-11 MED ORDER — ACETAMINOPHEN 325 MG PO TABS
650.0000 mg | ORAL_TABLET | Freq: Once | ORAL | Status: AC
  Administered 2023-06-11: 650 mg via ORAL
  Filled 2023-06-11: qty 2
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
## 2023-06-11 NOTE — ED Provider Notes (Cosign Needed Addendum)
 [place] EMERGENCY DEPARTMENT AT [facility] Provider Note   CSN: [id number] Arrival date & time: 06/11/23  1202     History  Chief Complaint  Patient presents with   Psychiatric Evaluation    IVCd    Gabriel Wilson is a 73 y.o. male with PMHx HTN, dementia, HLD who presents to ED with IVC. Patient is resting in best NAD and denies any infectious symptoms today. Patient stating that sometimes he hears weird noises at his house and he is unsure if other people are hearing them or not. Patient denies visual hallucinations. Denies SI/HI. Patient unsure why he is here today.  IVC paperwork stating that patient is not taking his medications and erratically interacting with community members. Patient also does not have running water or electricity at home and is not tending to hygiene. Patient has been seen talking to himself and to imaginary friends. IVC paperwork stating that patient is a danger to himself.   Patient stating that I can call his sister, Amalia Badder. I called the number which went to voicemail. I left a HIPAA compliant voicemail. I have not received a call back.   Amalia Badder did eventually call back and stated that she believes patient has paranoid schizophrenia because he believes that someone names"left" is after him. Sister stating that patient has been showing these symptoms since before 1995. Sister stating that in the 46's patient was tape recording conversations and acting paranoid stating that people were after him.   HPI     Home Medications Prior to Admission medications   Medication Sig Start Date End Date Taking? Authorizing Provider  aspirin 81 MG tablet Take 81 mg by mouth daily.    [provider]  cyclobenzaprine  (FLEXERIL ) 10 MG tablet Take 1 tablet (10 mg total) by mouth 3 (three) times daily as needed for muscle spasms. Patient not taking: Reported on 03/22/2016 09/13/15   Vivica Grounds D, PA  diltiazem (CARDIZEM) 90 MG tablet Take 90  mg by mouth 2 (two) times daily. 12/12/20   [provider]  ELIQUIS 5 MG TABS tablet Take 5 mg by mouth 2 (two) times daily. 11/27/20   [provider]  hydrochlorothiazide  (HYDRODIURIL ) 25 MG tablet Take 1 tablet (25 mg total) by mouth daily. Patient not taking: Reported on 12/23/2013 03/22/11   Dain Drown, MD  lisinopril-hydrochlorothiazide  (ZESTORETIC) 20-12.5 MG tablet Take 1 tablet by mouth daily. 01/26/21   [provider]  meloxicam  (MOBIC ) 15 MG tablet Take 1 tablet (15 mg total) by mouth daily. 03/22/16   Buena Carmine, NP      Allergies    Celebrex [celecoxib]    Review of Systems   Review of Systems  Psychiatric/Behavioral:  Positive for confusion.     Physical Exam Updated Vital Signs BP 115/71 (BP Location: Right Arm)   Pulse (!) 53   Temp 98.5 F (36.9 C) (Oral)   Resp 18   SpO2 100%  Physical Exam Vitals and nursing note reviewed.  Constitutional:      General: He is not in acute distress.    Appearance: He is not ill-appearing or toxic-appearing.  HENT:     Head: Normocephalic and atraumatic.     Mouth/Throat:     Mouth: Mucous membranes are moist.  Eyes:     General: No scleral icterus.       Right eye: No discharge.        Left eye: No discharge.     Conjunctiva/sclera: Conjunctivae normal.  Cardiovascular:     Rate and Rhythm: Normal rate and regular rhythm.     Pulses: Normal pulses.     Heart sounds: Normal heart sounds. No murmur heard. Pulmonary:     Effort: Pulmonary effort is normal. No respiratory distress.     Breath sounds: Normal breath sounds. No wheezing, rhonchi or rales.  Abdominal:     General: Abdomen is flat. Bowel sounds are normal. There is no distension.     Palpations: Abdomen is soft. There is no mass.     Tenderness: There is no abdominal tenderness.  Skin:    General: Skin is warm and dry.     Findings: No rash.  Neurological:     General: No focal deficit present.     Mental Status:  He is alert. Mental status is at baseline.     Comments: GCS 15. Speech is goal oriented. No deficits appreciated to CN III-XII; Patient moves extremities without ataxia. AAOx2.  Psychiatric:        Mood and Affect: Mood normal.     ED Results / Procedures / Treatments   Labs (all labs ordered are listed, but only abnormal results are displayed) Labs Reviewed  COMPREHENSIVE METABOLIC PANEL WITH GFR - Abnormal; Notable for the following components:      Result Value   Glucose, Bld 154 (*)    BUN 27 (*)    Total Protein 6.4 (*)    All other components within normal limits  CBC WITH DIFFERENTIAL/PLATELET - Abnormal; Notable for the following components:   RBC 3.79 (*)    Hemoglobin 11.7 (*)    HCT 36.7 (*)    All other components within normal limits  ETHANOL  RAPID URINE DRUG SCREEN, HOSP PERFORMED  URINALYSIS, W/ REFLEX TO CULTURE (INFECTION SUSPECTED)    EKG None  Radiology No results found.  Procedures Procedures    Medications Ordered in ED Medications - No data to display  ED Course/ Medical Decision Making/ A&P                                 Medical Decision Making Amount and/or Complexity of Data Reviewed Labs: ordered. Radiology: ordered.  Risk OTC drugs.   This patient presents to the ED for concern of AMS, this involves an extensive number of treatment options, and is a complaint that carries with it a high risk of complications and morbidity.  The differential diagnosis includes CVA, ICH, intracranial mass, critical dehydration, heptatic dysfunction, uremia, hypercarbia, intoxication/withdrawal, endocrine abnormality, sepsis/infection.   Co morbidities that complicate the patient evaluation  HTN, dementia, HLD   Additional history obtained:  IVC: patient is not taking his medications and erratically interacting with community members. Patient also does not have running water or electricity at home and is not tending to hygiene. Patient has been  seen talking to himself and to imaginary friends. IVC paperwork stating that patient is a danger to himself.     Problem List / ED Course / Critical interventions / Medication management  Patient presented for AMS and IVCd.  Patient declines any symptoms today.  Patient AAOx2.  Physical exam reassuring.  Patient afebrile with stable vitals. I Ordered, and personally interpreted labs.  EtOH within normal limits.  UDS negative.  UA not concerning for UTI.  CMP with mild hyperglycemia 154.  BUN slightly elevated 27.  Creatinine within normal limits.  CBC without leukocytosis.  There is mild  anemia with hemoglobin at 11.7 which is near patient's baseline.  I personally viewed and interpreted the EKG/cardiac monitored which showed an underlying rhythm of: Sinus bradycardia I ordered CT head given patient's orientation status to ensure that we were not missing a reason for his worsening dementia. This imaging was without acute concern.  I have reviewed the patients home medicines and have made adjustments as needed. Patient placed in psychiatric/behavioral hold. Patient is medically cleared for psych disposition.  RN later telling me that patient was concerned about a sore on his right shoulder. There is a very small scab on right shoulder without any swelling or surrounding erythema - will provide patient with tylenol .    Social Determinants of Health:  dementia           Final Clinical Impression(s) / ED Diagnoses Final diagnoses:  Involuntary commitment    Rx / DC Orders ED Discharge Orders     None          Don Fritter 06/11/23 Burnell Carry, MD 06/12/23 1256

## 2023-06-11 NOTE — Consult Note (Signed)
 Attempt to evaluate patient failed due to his Memory and cognitive care issues.  He is oriented to his name and place he is in.  He is not aware of why he came to the ER, year, date and month.  He is calm and tries to answer questions.  No agitation or anger noted.  Patient states he lives alone, his brother is dead but immediately changed stating he believes his brother is alive.  He reported that his sister is "crazy" but did not explain further.  Call to his sister Gabriel Wilson failed twice as she did not answer her calls but HIPAA compliant message and number left for her to call provider.

## 2023-06-11 NOTE — Progress Notes (Addendum)
 LCSW Progress Note:   MRN: [id number] Gabriel Wilson  06/11/2023 9:59 pm  Per Arvell Latin, NP, inpatient psychiatric hospitalization is recommended following medical stabilization. The patient was involuntarily committed on 06/11/2023. Patient referred to the [facility] House Supervisor, Loetta Ringer, RN to consider for [facility] psych admission. This clinician was subsequently advised to forward the patient's information to alternative facilities. Referral packets have been faxed to the following hospitals for consideration of placement:   Destination  Service Provider Request Status  Address Phone Fax Patient Preferred  [facility] Pending - Request Sent  [address], [place] [address] [phone] --  [facility] Pending - Request Sent  [address] [place] [address] [phone] --  [facility] Pending - Request Sent  [address] [place], [place] [address] [phone] --  [facility] Pending - Request Sent  [address] [place] [address] [phone] --  [facility] Pending - Request Sent  [address] [place], [place] [address] [phone] --  [facility] Pending - Request Sent  [address] [place], [place] [address] [phone] --  [facility] Pending - Request Sent  [address] [place] [address] [phone] --  [facility] Pending - Request Sent  [address], [place] [address] [phone] --  [facility] Pending - Request Sent  [address], [place] [address] [phone] --  [facility] Pending - Request Sent  [address] [place] [address] [phone] --  [facility] Pending - Request Sent  [address] [place] [address] [phone] --  [facility] Pending - Request Sent  [place] [phone] --  [facility] Pending - Request Sent  [address] [place] [address] [phone] --  [facility] Pending - Request Sent  [address] [place] [phone] --  [facility] Pending - Request Sent  [address] [place] [address] [phone] --  [facility] Pending - Request Sent  [address], [place] [address] [phone] --  [facility] Pending - Request Sent  [address], [place] [address] [phone] --  [facility] Pending - Request Sent  [address] [place] [address] [phone] --  [facility] Pending - Request [facility]  [address], [place] [address] [phone] --  [facility]patient EFAX Pending - Request Sent  [place] [phone] --  [facility] Pending - Request Sent -- [address] [place], [place] [address] [phone] --  [facility] Patient Placement Pending - Request Sent -- [facility], [place] [phone] --  [facility] Pending - Request Sent -- [address] [place] [address] [phone] --  [facility] Pending - Request Sent -- [address] [place] Na[place] [address] [phone] --  [facility] Pending - Request Sent -- [address] [place] [address] [phone] --  [facility] Pending - Request Sent -- [address] [place] [address] [phone] --  [facility] Pending - Request Sent -- [address], [place] [address] [phone] --  [facility] Pending - Request Sent -- [address] [place] [address] [phone] --  [facility] Pending - No Request Sent -- [place] [phone] -- --

## 2023-06-11 NOTE — ED Triage Notes (Signed)
 Patient to [facility] by [facility]. Patient IVCd. Patient diagnosed with  dementia.  Have erratic behaviors and talking to people not there. See IVC

## 2023-06-11 NOTE — Progress Notes (Addendum)
 LCSW Progress Note:    MRN: [id number] Gabriel Wilson  06/11/2023 10:33 pm  Received a call from Brunilda Capra, Intake Coordinator at [facility]. He reported speaking with the patient's nurse, Valerie Gather, RN, at [facility] to address any concerns. Following their discussion, he confirmed a bed offer with this Clinician. The patient has been officially accepted for admission to [facility] on 06/12/2023, after 10:00 AM. The accepting provider is Osvaldo Blender, MD. Nurse-to-nurse report may be called in to [phone].   The patient's care team has provided disposition updates.

## 2023-06-11 NOTE — ED Notes (Signed)
 Pt has been calm and in his room with no complaints since moving to this area.

## 2023-06-11 NOTE — Progress Notes (Signed)
 LCSW Progress Note:    MRN: [id number] Gabriel Wilson  06/11/2023 10:15 pm   Received a call from Tiffany with [facility], patient declined due to dementia diagnosis.

## 2023-06-12 NOTE — ED Provider Notes (Signed)
 Emergency Medicine Observation Re-evaluation Note  Gabriel Wilson is a 73 y.o. male, seen on rounds today.  Pt initially presented to the ED for complaints of Psychiatric Evaluation (IVCd) Currently, the patient is IVC and being evaluated for dementia with hearing voices.  Review of records revealed that his sister thought that he had paranoid schizophrenia and has been paranoid that somebody is after him.Gabriel Wilson  Physical Exam  BP (!) 160/74 (BP Location: Right Arm)   Pulse (!) 46   Temp (!) 97.4 F (36.3 C) (Oral)   Resp 16   SpO2 97%  Physical Exam General: Well-developed no acute distress Cardiac: Mild bradycardia with stable blood pressure Lungs: Normal respiratory rate and oxygen saturation Psych: Patient awake and alert and interactive  ED Course / MDM  EKG:   I have reviewed the labs performed to date as well as medications administered while in observation.  Recent changes in the last 24 hours include reviewed labs CBC, mild anemia, complete metabolic panel with mild hyperglycemia, urine clear, urine drug screen and alcohol negative.  CT head reviewed and no evidence of acute intracranial abnormality noted  Plan  Current plan is for behavioral health note advises admission after medical hospitalization.  Patient is medically cleared here in ED.    Gabriel Blush, MD 06/12/23 1113

## 2023-10-24 ENCOUNTER — Ambulatory Visit: Admitting: Urology

## 2023-10-24 VITALS — BP 168/89 | HR 64 | Ht 72.0 in | Wt 214.0 lb

## 2023-10-24 DIAGNOSIS — R972 Elevated prostate specific antigen [PSA]: Secondary | ICD-10-CM | POA: Diagnosis not present

## 2023-10-24 LAB — MICROSCOPIC EXAMINATION: Bacteria, UA: NONE SEEN

## 2023-10-24 LAB — URINALYSIS, COMPLETE
Bilirubin, UA: NEGATIVE
Glucose, UA: NEGATIVE
Ketones, UA: NEGATIVE
Leukocytes,UA: NEGATIVE
Nitrite, UA: NEGATIVE
Protein,UA: NEGATIVE
RBC, UA: NEGATIVE
Specific Gravity, UA: 1.01 (ref 1.005–1.030)
Urobilinogen, Ur: 0.2 mg/dL (ref 0.2–1.0)
pH, UA: 6.5 (ref 5.0–7.5)

## 2023-10-24 NOTE — Progress Notes (Signed)
 10/24/2023 10:07 AM   Gabriel Wilson [birth year] [id number]  Referring provider: Cordella Corning, FNP [address] [place],  [place] [address]  Chief Complaint  Patient presents with   Elevated PSA    HPI: Gabriel Wilson is a 73 y.o. male referred for evaluation of an elevated PSA.  PSA levels: Baseline PSA level: Lower urinary tract symptoms: Family history prostate cancer first-degree relatives: Past urologic history:  PMH: Past Medical History:  Diagnosis Date   Hypertension     Surgical History: Past Surgical History:  Procedure Laterality Date   HERNIA REPAIR      Home Medications:  Allergies as of 10/24/2023       Reactions   Celebrex [celecoxib] Swelling        Medication List        Accurate as of October 24, 2023 10:07 AM. If you have any questions, ask your nurse or doctor.          aspirin 81 MG tablet Take 81 mg by mouth daily.   diltiazem 90 MG tablet Commonly known as: CARDIZEM Take 90 mg by mouth 2 (two) times daily.   donepezil 10 MG tablet Commonly known as: ARICEPT Take 10 mg by mouth daily.   doxycycline 100 MG capsule Commonly known as: VIBRAMYCIN Take 100 mg by mouth 2 (two) times daily.   Eliquis 5 MG Tabs tablet Generic drug: apixaban Take 5 mg by mouth 2 (two) times daily.   lisinopril-hydrochlorothiazide  20-12.5 MG tablet Commonly known as: ZESTORETIC Take 1 tablet by mouth daily.        Allergies:  Allergies  Allergen Reactions   Celebrex [Celecoxib] Swelling    Family History: Family History  Problem Relation Age of Onset   Cancer Mother        breast cancer   Cancer Father 86       prostate cancer    Social History:  reports that he has been smoking cigarettes. He has a 10 pack-year smoking history. He uses smokeless tobacco. He reports current alcohol use of about 1.0 standard drink of alcohol per week. He reports that he does not use drugs.   Physical Exam: BP (!)  168/89   Pulse 64   Ht 6' (1.829 m)   Wt 214 lb (97.1 kg)   BMI 29.02 kg/m   Constitutional:  Alert and oriented, No acute distress. HEENT: [facility] AT Respiratory: Normal respiratory effort, no increased work of breathing. GU: Prostate Psychiatric: Normal mood and affect.  Laboratory Data: Lab Results  Component Value Date   WBC 6.4 06/11/2023   HGB 11.7 (L) 06/11/2023   HCT 36.7 (L) 06/11/2023   MCV 96.8 06/11/2023   PLT 167 06/11/2023    Lab Results  Component Value Date   CREATININE 1.19 06/11/2023    No results found for: PSA  No results found for: TESTOSTERONE  No results found for: HGBA1C  Urinalysis    Component Value Date/Time   COLORURINE YELLOW 06/11/2023 1313   APPEARANCEUR CLEAR 06/11/2023 1313   LABSPEC 1.023 06/11/2023 1313   PHURINE 5.0 06/11/2023 1313   GLUCOSEU NEGATIVE 06/11/2023 1313   HGBUR NEGATIVE 06/11/2023 1313   BILIRUBINUR NEGATIVE 06/11/2023 1313   KETONESUR NEGATIVE 06/11/2023 1313   PROTEINUR NEGATIVE 06/11/2023 1313   NITRITE NEGATIVE 06/11/2023 1313   LEUKOCYTESUR NEGATIVE 06/11/2023 1313    Lab Results  Component Value Date   BACTERIA NONE SEEN 06/11/2023    Pertinent Imaging: *** No results found  for this or any previous visit.  No results found for this or any previous visit.  No results found for this or any previous visit.  No results found for this or any previous visit.  No results found for this or any previous visit.  No results found for this or any previous visit.  No results found for this or any previous visit.  No results found for this or any previous visit.   Assessment & Plan:    1.  Elevated PSA Although PSA is a prostate cancer screening test he was informed that cancer is not the most common cause of an elevated PSA. Other potential causes including BPH and inflammation were discussed.  He was informed that the only way to adequately diagnose prostate cancer would be transrectal ultrasound  and biopsy of the prostate. The procedure was discussed including potential risks of bleeding and infection/sepsis. He was also informed that a negative biopsy does not conclusively rule out the possibility that prostate cancer may be present and that continued monitoring is required.  The use of newer adjunctive blood and urine tests to predict the probability of high-grade prostate cancer were discussed. The use of multiparametric prostate MRI to evaluate for lesions suspicious for high grade prostate cancer and aid in targeted bx was reviewed.  Continued periodic surveillance was also discussed.   No follow-ups on file.  Gabriel JAYSON Barba, MD  [facility] [address] [place], [place] [address] [phone]

## 2023-10-25 ENCOUNTER — Encounter: Payer: Self-pay | Admitting: Urology

## 2023-10-28 ENCOUNTER — Encounter: Payer: Self-pay | Admitting: Urology

## 2023-11-19 ENCOUNTER — Telehealth: Payer: Self-pay | Admitting: Urology

## 2023-11-19 NOTE — Telephone Encounter (Signed)
 It does not look like we have received any prior urology records.  Please order prostate MRI (elevated PSA; history of prostate cancer).  Patient has a legal guardian and lives in a group home.

## 2023-11-20 ENCOUNTER — Other Ambulatory Visit: Payer: Self-pay | Admitting: *Deleted

## 2023-11-20 DIAGNOSIS — R972 Elevated prostate specific antigen [PSA]: Secondary | ICD-10-CM

## 2023-11-20 NOTE — Telephone Encounter (Signed)
 Left voice mail for Powell to return the call , Prostate MRi ordered.

## 2023-11-29 ENCOUNTER — Ambulatory Visit
Admission: RE | Admit: 2023-11-29 | Discharge: 2023-11-29 | Disposition: A | Discharge status: Home / Self Care | Source: Ambulatory Visit | Attending: Urology | Admitting: Urology

## 2023-11-29 DIAGNOSIS — R972 Elevated prostate specific antigen [PSA]: Secondary | ICD-10-CM | POA: Diagnosis present

## 2023-11-29 MED ORDER — GADOBUTROL 1 MMOL/ML IV SOLN
10.0000 mL | Freq: Once | INTRAVENOUS | Status: AC | PRN
  Administered 2023-11-29: 10 mL via INTRAVENOUS

## 2023-11-30 ENCOUNTER — Ambulatory Visit: Payer: Self-pay | Admitting: Urology

## 2023-11-30 LAB — COLOGUARD

## 2023-11-30 NOTE — Telephone Encounter (Signed)
 Called [facility] and [facility] spoke with Ronal ([employer]) and gave result information and advised that Gabriel Wilson (surgery coordinator) will be contacting them to schedule the biopsy that will be done at [facility].

## 2023-12-01 ENCOUNTER — Other Ambulatory Visit: Payer: Self-pay | Admitting: Urology

## 2023-12-01 DIAGNOSIS — R972 Elevated prostate specific antigen [PSA]: Secondary | ICD-10-CM

## 2023-12-03 ENCOUNTER — Other Ambulatory Visit: Payer: Self-pay

## 2023-12-03 DIAGNOSIS — R972 Elevated prostate specific antigen [PSA]: Secondary | ICD-10-CM

## 2023-12-03 NOTE — Progress Notes (Unsigned)
 Surgical Physician Order Form [facility] [place]  Dr. Glendia Barba, MD  * Scheduling expectation : Next Available: Patient lives in a group home and legal guardian is DSS  *Length of Case: 30 min  *Clearance needed: Per anesthesia  *Anticoagulation Instructions: Has been on Eliquis and aspirin.  Apparently no longer taking but please verify  *Aspirin Instructions: As above  *Post-op visit Date/Instructions: TBD  *Diagnosis: Elevated PSA, abnormal prostate MRI  *Procedure:  Prostate Biopsy (55700) TRUS (23127) US  Hlpiz(23057)    Additional orders: N/A  -Admit type: OUTpatient  -Anesthesia: MAC  -VTE Prophylaxis Standing Order SCD's       Other:   -Standing Lab Orders Per Anesthesia    Lab other: UA&Urine Culture  -Standing Test orders EKG/Chest x-ray per Anesthesia       Test other:   - Medications:  Ceftriaxone(Rocephin) 1gm IV  -Other orders:  N/A

## 2023-12-06 ENCOUNTER — Other Ambulatory Visit: Payer: Self-pay | Admitting: Urology

## 2023-12-06 ENCOUNTER — Telehealth: Payer: Self-pay

## 2023-12-06 ENCOUNTER — Encounter: Payer: Self-pay | Admitting: Urgent Care

## 2023-12-06 DIAGNOSIS — R972 Elevated prostate specific antigen [PSA]: Secondary | ICD-10-CM

## 2023-12-06 NOTE — Progress Notes (Signed)
   [facility] Surgical Posting Form  Surgery Date: Date: 12/20/2023  Surgeon: Dr. Glendia Barba, MD  Inpt ( No  )   Outpt (Yes)   Obs ( No  )   Diagnosis: R97.20 Elevated Prostate Specific Antigen  -CPT: 55700, [id number], [phone]  Surgery: Transrectal Ultrasound Guided Prostate Biopsy   Stop Anticoagulations: Yes, will hold Eliquis  *Orders entered into EPIC  Date: 12/06/23   *Case booked in EPIC  Date: 12/06/23  *Notified pt of Surgery: Date: 12/06/23  PRE-OP UA & CX: yes, will obtain at facility tomorrow 12/07/23  *Placed into Prior Authorization Work [place] Date: 12/06/23  Assistant/laser/rep:Yes, ultrasound to be present for case.

## 2023-12-06 NOTE — Telephone Encounter (Signed)
 Per Dr. Twylla,  Patient is to be scheduled for Transrectal Ultrasound Guided Prostate Biopsy   [place] (POA- DSS) and [facility] were contacted and possible surgical dates were discussed, Thursday November 20th, 2025 was agreed upon for surgery. Patient was instructed that Dr. Twylla will require them to provide a pre-op UA & CX prior to surgery. This will be collected at the facility on 12/07/2023 and results faxed to our clinic.   Patient was directed to call [phone] between 1-3pm the day before surgery to find out surgical arrival time.  Instructions were given not to eat or drink from midnight on the night before surgery and have a driver for the day of surgery. On the surgery day patient was instructed to enter through the [facility] entrance of [facility] report the Same Day Surgery desk.   Pre-Admit Testing will be in contact via phone to set up an interview with the anesthesia team to review your history and medications prior to surgery.   Reminder of this information was sent via Fax to the facility.

## 2023-12-14 ENCOUNTER — Other Ambulatory Visit: Payer: Self-pay

## 2023-12-14 ENCOUNTER — Encounter
Admission: RE | Admit: 2023-12-14 | Discharge: 2023-12-14 | Disposition: A | Discharge status: Home / Self Care | Source: Ambulatory Visit | Attending: Urology | Admitting: Urology

## 2023-12-14 HISTORY — DX: Depression, unspecified: F32.A

## 2023-12-14 HISTORY — DX: Elevated prostate specific antigen (PSA): R97.20

## 2023-12-14 HISTORY — DX: Other general symptoms and signs: R68.89

## 2023-12-14 HISTORY — DX: Cervicalgia: M54.2

## 2023-12-14 HISTORY — DX: Impetigo, unspecified: L01.00

## 2023-12-14 NOTE — Progress Notes (Addendum)
 This nurse TC to [facility] and spoke with Verneita Lay, LPN to confirm that patient is no longer on Eliquis, 5mg , nurse stated that patient is still taking this medication Eliquis 5 mg BID. Will check with doctor to see if wants to stop before procedure. Also asked if they were able to do the UA and urine culture that doctor ordered before the procedure. The nurse stated that they can do it today, also will do CBC and BMP today and will fax results to our office at [phone] once she has the results. Consent form was faxed to facility.

## 2023-12-14 NOTE — Patient Instructions (Addendum)
 Your procedure is scheduled on: Thursday 12/20/2023 Report to the Registration Desk on the 1st floor of the Medical Mall. To find out your arrival time, please call [phone] between 1PM - 3PM on: Wednesday 12/19/23  If your arrival time is 6:00 am, do not arrive before that time as the Medical Mall entrance doors do not open until 6:00 am.  REMEMBER: Instructions that are not followed completely may result in serious medical risk, up to and including death; or upon the discretion of your surgeon and anesthesiologist your surgery may need to be rescheduled.  Do not eat food or drink fluids after midnight the night before surgery.  No gum chewing or hard candies.   One week prior to surgery: Stop Anti-inflammatories (NSAIDS) such as Advil, Aleve, Ibuprofen, Motrin, Naproxen, Naprosyn and Aspirin based products such as Excedrin, Goody's Powder, BC Powder. Stop ANY OVER THE COUNTER supplements until after surgery. cyanocobalamin 1000 MCG   You may however, continue to take Tylenol  if needed for pain up until the day of surgery.   Stop apixaban (ELIQUIS) 5 MG 2 days prior to surgery (take last dose Monday 12/17/23) as instructed by your doctor.   Continue taking all of your other prescription medications up until the day of surgery.  ON THE DAY OF SURGERY ONLY TAKE THESE MEDICATIONS WITH SIPS OF WATER:  diltiazem (CARDIZEM LA) 180 MG  risperiDONE (RISPERDAL) 1 MG    Fleets enema the morning of procedure at 6 am before coming to the hospital.   No Alcohol for 24 hours before or after surgery.  No Smoking including e-cigarettes for 24 hours before surgery.  No chewable tobacco products for at least 6 hours before surgery.  No nicotine patches on the day of surgery.  Do not use any recreational drugs for at least a week (preferably 2 weeks) before your surgery.  Please be advised that the combination of cocaine and anesthesia may have negative outcomes, up to and including  death. If you test positive for cocaine, your surgery will be cancelled.  On the morning of surgery brush your teeth with toothpaste and water, you may rinse your mouth with mouthwash if you wish. Do not swallow any toothpaste or mouthwash.  Fresh shower the morning of procedure.  Do not wear jewelry, make-up, hairpins, clips or nail polish.  For welded (permanent) jewelry: bracelets, anklets, waist bands, etc.  Please have this removed prior to surgery.  If it is not removed, there is a chance that hospital personnel will need to cut it off on the day of surgery.  Do not wear lotions, powders, or perfumes.   Do not shave body hair from the neck down 48 hours before surgery.  Contact lenses, hearing aids and dentures may not be worn into surgery.  Do not bring valuables to the hospital. [facility] is not responsible for any missing/lost belongings or valuables.   Notify your doctor if there is any change in your medical condition (cold, fever, infection).  Wear comfortable clothing (specific to your surgery type) to the hospital.  After surgery, you can help prevent lung complications by doing breathing exercises.  Take deep breaths and cough every 1-2 hours. Your doctor may order a device called an Incentive Spirometer to help you take deep breaths. When coughing or sneezing, hold a pillow firmly against your incision with both hands. This is called "splinting." Doing this helps protect your incision. It also decreases belly discomfort.  If you are being admitted to the  hospital overnight, leave your suitcase in the car. After surgery it may be brought to your room.  In case of increased patient census, it may be necessary for you, the patient, to continue your postoperative care in the Same Day Surgery department.  If you are being discharged the day of surgery, you will not be allowed to drive home. You will need a responsible individual to drive you home and stay with you for 24  hours after surgery.   If you are taking public transportation, you will need to have a responsible individual with you.  Please call the Pre-admissions Testing Dept. at [phone] if you have any questions about these instructions.  Surgery Visitation Policy:  Patients having surgery or a procedure may have two visitors.  Children under the age of 83 must have an adult with them who is not the patient.  Inpatient Visitation:    Visiting hours are 7 a.m. to 8 p.m. Up to four visitors are allowed at one time in a patient room. The visitors may rotate out with other people during the day.  One visitor age 52 or older may stay with the patient overnight and must be in the room by 8 p.m.   Merchandiser, Retail to address health-related social needs:  [email or web address]

## 2023-12-14 NOTE — Progress Notes (Signed)
 REQUEST FOR CARDIAC CLEARANCE       Date: 12/14/23  [facility]  Faxed to: [phone]   Procedure: Prostate Biopsy Scheduled for 12/20/2023    Date of Procedure: 12/20/2023   Provider: Glendia Barba     Cardiac  Clearance : Yes  Reason: Hold  Eliquis for 7 days prior       Risk Assessment:    Low   []       Moderate   []     High   []           This patient is optimized for surgery  YES []       NO   []    I recommend further assessment/workup prior to procedure:    YES []      NO  []   Appointment scheduled for: _______________________   Further recommendations: ______________________________    Physician Signature:__________________________________   Printed Name: ________________________________________   Date: _________________

## 2023-12-17 ENCOUNTER — Encounter: Payer: Self-pay | Admitting: Urgent Care

## 2023-12-17 ENCOUNTER — Other Ambulatory Visit: Payer: Self-pay

## 2023-12-19 MED ORDER — FLEET ENEMA RE ENEM: 1.0000 | ENEMA | Freq: Once | RECTAL | Status: DC

## 2023-12-19 MED ORDER — CHLORHEXIDINE GLUCONATE 0.12 % MT SOLN: 15.0000 mL | Freq: Once | OROMUCOSAL | Status: DC

## 2023-12-19 MED ORDER — ORAL CARE MOUTH RINSE: 15.0000 mL | Freq: Once | OROMUCOSAL | Status: DC

## 2023-12-19 MED ORDER — SODIUM CHLORIDE 0.9 % IV SOLN
1.0000 g | INTRAVENOUS | Status: DC
  Filled 2023-12-19: qty 10

## 2023-12-19 MED ORDER — LACTATED RINGERS IV SOLN: INTRAVENOUS | Status: DC

## 2023-12-19 NOTE — Progress Notes (Signed)
 This nurse called [facility] and spoke with [facility] and asked if the patient was able to have his surgery tomorrow. Melissa stated the patient's urinalysis had arrive to the office and at this moment the patient is to report to [facility] for his surgery. Verneita at [facility] was called and notified that the patient is to arrive at 9:00 for his surgery and he is to have a fleets enema prior to arrival. Verneita verbalized understanding.

## 2023-12-20 ENCOUNTER — Ambulatory Visit: Admission: RE | Admit: 2023-12-20 | Source: Home / Self Care | Admitting: Urology

## 2023-12-20 ENCOUNTER — Ambulatory Visit: Admission: RE | Admit: 2023-12-20 | Source: Ambulatory Visit

## 2023-12-20 ENCOUNTER — Encounter: Admission: RE | Payer: Self-pay | Source: Home / Self Care

## 2023-12-20 ENCOUNTER — Telehealth: Payer: Self-pay

## 2023-12-20 DIAGNOSIS — R972 Elevated prostate specific antigen [PSA]: Secondary | ICD-10-CM

## 2023-12-20 SURGERY — BIOPSY, PROSTATE
Anesthesia: Monitor Anesthesia Care

## 2023-12-20 NOTE — Telephone Encounter (Signed)
 Call transferred from front desk-   Joen RN case manager at [facility] states the pt did not do his enema and had a full breakfast this morning. She states there was some miscommunication within the facility.   Megan in OR aware.   Message sent to [facility].   Will send to [facility] to get pt r/s.

## 2023-12-27 ENCOUNTER — Other Ambulatory Visit: Payer: Self-pay

## 2023-12-27 ENCOUNTER — Emergency Department

## 2023-12-27 ENCOUNTER — Emergency Department
Admission: EM | Admit: 2023-12-27 | Discharge: 2023-12-27 | Disposition: A | Discharge status: Skilled Nursing Facility | Attending: Emergency Medicine | Admitting: Emergency Medicine

## 2023-12-27 DIAGNOSIS — W19XXXA Unspecified fall, initial encounter: Secondary | ICD-10-CM | POA: Diagnosis not present

## 2023-12-27 DIAGNOSIS — I10 Essential (primary) hypertension: Secondary | ICD-10-CM | POA: Diagnosis not present

## 2023-12-27 DIAGNOSIS — M25561 Pain in right knee: Secondary | ICD-10-CM | POA: Insufficient documentation

## 2023-12-27 DIAGNOSIS — U071 COVID-19: Secondary | ICD-10-CM | POA: Insufficient documentation

## 2023-12-27 DIAGNOSIS — F0390 Unspecified dementia without behavioral disturbance: Secondary | ICD-10-CM | POA: Diagnosis not present

## 2023-12-27 DIAGNOSIS — Z7901 Long term (current) use of anticoagulants: Secondary | ICD-10-CM | POA: Insufficient documentation

## 2023-12-27 DIAGNOSIS — R42 Dizziness and giddiness: Secondary | ICD-10-CM | POA: Diagnosis present

## 2023-12-27 LAB — COMPREHENSIVE METABOLIC PANEL WITH GFR
ALT: 23 U/L (ref 0–44)
AST: 34 U/L (ref 15–41)
Albumin: 4.5 g/dL (ref 3.5–5.0)
Alkaline Phosphatase: 66 U/L (ref 38–126)
Anion gap: 11 (ref 5–15)
BUN: 16 mg/dL (ref 8–23)
CO2: 26 mmol/L (ref 22–32)
Calcium: 9.8 mg/dL (ref 8.9–10.3)
Chloride: 102 mmol/L (ref 98–111)
Creatinine, Ser: 1.1 mg/dL (ref 0.61–1.24)
GFR, Estimated: >60 mL/min (ref >60)
Glucose, Bld: 101 mg/dL — ABNORMAL HIGH (ref 70–99)
Potassium: 4.3 mmol/L (ref 3.5–5.1)
Sodium: 139 mmol/L (ref 135–145)
Total Bilirubin: 0.3 mg/dL (ref 0.0–1.2)
Total Protein: 7.5 g/dL (ref 6.5–8.1)

## 2023-12-27 LAB — CBC WITH DIFFERENTIAL/PLATELET
Abs Immature Granulocytes: 0.02 K/uL (ref 0.00–0.07)
Basophils Absolute: 0 K/uL (ref 0.0–0.1)
Basophils Relative: 0 %
Eosinophils Absolute: 0 K/uL (ref 0.0–0.5)
Eosinophils Relative: 0 %
HCT: 36.8 % — ABNORMAL LOW (ref 39.0–52.0)
Hemoglobin: 12.1 g/dL — ABNORMAL LOW (ref 13.0–17.0)
Immature Granulocytes: 0 %
Lymphocytes Relative: 16 %
Lymphs Abs: 0.8 K/uL (ref 0.7–4.0)
MCH: 30.5 pg (ref 26.0–34.0)
MCHC: 32.9 g/dL (ref 30.0–36.0)
MCV: 92.7 fL (ref 80.0–100.0)
Monocytes Absolute: 0.9 K/uL (ref 0.1–1.0)
Monocytes Relative: 17 %
Neutro Abs: 3.5 K/uL (ref 1.7–7.7)
Neutrophils Relative %: 67 %
Platelets: 180 K/uL (ref 150–400)
RBC: 3.97 MIL/uL — ABNORMAL LOW (ref 4.22–5.81)
RDW: 13.6 % (ref 11.5–15.5)
WBC: 5.3 K/uL (ref 4.0–10.5)
nRBC: 0 % (ref 0.0–0.2)

## 2023-12-27 LAB — RESP PANEL BY RT-PCR (RSV, FLU A&B, COVID)  RVPGX2
Influenza A by PCR: NEGATIVE
Influenza B by PCR: NEGATIVE
Resp Syncytial Virus by PCR: NEGATIVE
SARS Coronavirus 2 by RT PCR: POSITIVE — AB

## 2023-12-27 LAB — TROPONIN T, HIGH SENSITIVITY
Troponin T High Sensitivity: 27 ng/L — ABNORMAL HIGH (ref 0–19)
Troponin T High Sensitivity: 31 ng/L — ABNORMAL HIGH (ref 0–19)

## 2023-12-27 LAB — URINALYSIS, ROUTINE W REFLEX MICROSCOPIC
Bilirubin Urine: NEGATIVE
Glucose, UA: NEGATIVE mg/dL
Hgb urine dipstick: NEGATIVE
Ketones, ur: NEGATIVE mg/dL
Leukocytes,Ua: NEGATIVE
Nitrite: NEGATIVE
Protein, ur: NEGATIVE mg/dL
Specific Gravity, Urine: 1.013 (ref 1.005–1.030)
pH: 7 (ref 5.0–8.0)

## 2023-12-27 LAB — MAGNESIUM: Magnesium: 2 mg/dL (ref 1.7–2.4)

## 2023-12-27 MED ORDER — LACTATED RINGERS IV BOLUS
1000.0000 mL | Freq: Once | INTRAVENOUS | Status: AC
  Administered 2023-12-27: 1000 mL via INTRAVENOUS

## 2023-12-27 MED ORDER — ACETAMINOPHEN 500 MG PO TABS
1000.0000 mg | ORAL_TABLET | Freq: Once | ORAL | Status: AC
  Administered 2023-12-27: 1000 mg via ORAL
  Filled 2023-12-27: qty 2

## 2023-12-27 NOTE — ED Notes (Signed)
 [facility]  called  for  transport  to  [employer]

## 2023-12-27 NOTE — ED Notes (Signed)
 RN attempted to call legal guardian of pt x2. Unsuccessful at contacting legal guardian.

## 2023-12-27 NOTE — ED Triage Notes (Signed)
 Pt to ED via [facility] from [facility] for multiple falls. Pt fell x2 yesterday and x1 today. Pt's falls were unwitnessed by staff. Pt reports he got dizzy and lost balance. Pt take blood thinners. Pt complaining of R knee pain. Pt oriented to self at baseline. Pt alert and oriented x1 at this time.

## 2023-12-27 NOTE — ED Provider Notes (Signed)
 [facility] Provider Note    Event Date/Time   First MD Initiated Contact with Patient 12/27/23 [phone]     (approximate)   History   Fall   HPI  Gabriel Wilson is a 73 y.o. male who presents to the ED for evaluation of Fall   Review a urology clinic visit from September.  History dementia, HTN.  Eliquis but uncertain why.  Patient presents to the ED from local SNF for evaluation of unwitnessed fall.  Here in the ED patient reports feeling lousy but cannot further elaborate on this.  History limited by his mental status  Had reported knee pain to a nurse during triage, when I asked him about that specifically he reports that he thinks his knees feel fine   Physical Exam   Triage Vital Signs: ED Triage Vitals  Encounter Vitals Group     BP      Girls Systolic BP Percentile      Girls Diastolic BP Percentile      Boys Systolic BP Percentile      Boys Diastolic BP Percentile      Pulse      Resp      Temp      Temp src      SpO2      Weight      Height      Head Circumference      Peak Flow      Pain Score      Pain Loc      Pain Education      Exclude from Growth Chart     Most recent vital signs: Vitals:   12/27/23 1100 12/27/23 1130  BP: (!) 174/91 (!) 177/99  Pulse: 71 67  Resp: 14 16  Temp:    SpO2: 98% 98%    General: Awake, no distress.  Pleasantly disoriented CV:  Good peripheral perfusion.  Resp:  Normal effort.  Abd:  No distention.  Soft and nontender MSK:  No deformity noted.  Passively range all 4 extremities without pain or impaired range of motion. Palpate all 4 extremities without evidence of deformity, tenderness or trauma Neuro:  No focal deficits appreciated. Other:     ED Results / Procedures / Treatments   Labs (all labs ordered are listed, but only abnormal results are displayed) Labs Reviewed  RESP PANEL BY RT-PCR (RSV, FLU A&B, COVID)  RVPGX2 - Abnormal; Notable for the following components:       Result Value   SARS Coronavirus 2 by RT PCR POSITIVE (*)    All other components within normal limits  COMPREHENSIVE METABOLIC PANEL WITH GFR - Abnormal; Notable for the following components:   Glucose, Bld 101 (*)    All other components within normal limits  CBC WITH DIFFERENTIAL/PLATELET - Abnormal; Notable for the following components:   RBC 3.97 (*)    Hemoglobin 12.1 (*)    HCT 36.8 (*)    All other components within normal limits  URINALYSIS, ROUTINE W REFLEX MICROSCOPIC - Abnormal; Notable for the following components:   Color, Urine YELLOW (*)    APPearance CLEAR (*)    All other components within normal limits  TROPONIN T, HIGH SENSITIVITY - Abnormal; Notable for the following components:   Troponin T High Sensitivity 27 (*)    All other components within normal limits  TROPONIN T, HIGH SENSITIVITY - Abnormal; Notable for the following components:   Troponin T High Sensitivity 31 (*)  All other components within normal limits  MAGNESIUM    EKG Sinus rhythm with a rate of 82 bpm.  Normal axis and intervals.  No clear signs of acute ischemia.  RADIOLOGY CXR interpreted by me without evidence of acute cardiopulmonary pathology. CT head interpreted by me without evidence of acute intracranial pathology CT cervical spine interpreted by me without evidence of fracture or dislocation  Official radiology report(s): CT Cervical Spine Wo Contrast Result Date: 12/27/2023 EXAM: CT CERVICAL SPINE WITHOUT CONTRAST 12/27/2023 10:11:07 AM TECHNIQUE: CT of the cervical spine was performed without the administration of intravenous contrast. Multiplanar reformatted images are provided for review. Automated exposure control, iterative reconstruction, and/or weight based adjustment of the mA/kV was utilized to reduce the radiation dose to as low as reasonably achievable. COMPARISON: None available. CLINICAL HISTORY: falls on eliquis FINDINGS: CERVICAL SPINE: BONES AND ALIGNMENT: There is  reversal of the normal cervical lordosis. No acute fracture or traumatic malalignment. DEGENERATIVE CHANGES: There is moderate chronic degenerative disc disease at C5-C6, with endplate ridging causing mild central spinal canal stenosis and mild-to-moderate bilateral neural foraminal stenosis. SOFT TISSUES: No prevertebral soft tissue swelling. LUNGS: There are biapical pulmonary blebs present, more pronounced on the right. IMPRESSION: 1. No acute abnormality of the cervical spine related to the reported falls. 2. Moderate chronic degenerative disc disease at C5-6 with endplate ridging causing mild central spinal canal stenosis and mild-to-moderate bilateral neural foraminal stenosis. 3. 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 For patients 71 years old, consider evaluation for a low-dose CT lung cancer screening program. Electronically signed by: Evalene Coho MD 12/27/2023 10:22 AM EST RP Workstation: [id number]   CT HEAD WO CONTRAST ( ) Result Date: 12/27/2023 EXAM: CT HEAD WITHOUT CONTRAST 12/27/2023 10:11:07 AM TECHNIQUE: CT of the head was performed without the administration of intravenous contrast. Automated exposure control, iterative reconstruction, and/or weight based adjustment of the mA/kV was utilized to reduce the radiation dose to as low as reasonably achievable. COMPARISON: CT of the head dated 06/11/2023. CLINICAL HISTORY: falls on eliquis FINDINGS: BRAIN AND VENTRICLES: No acute hemorrhage. No evidence of acute infarct. No hydrocephalus. No extra-axial collection. No mass effect or midline shift. Moderate generalized cerebral volume loss and mild periventricular white matter disease. ORBITS: No acute abnormality. SINUSES: There is mild mucosal disease within the sphenoid sinus. SOFT TISSUES AND SKULL: No acute soft tissue abnormality. No skull fracture. IMPRESSION: 1. No acute intracranial abnormality. 2. Moderate generalized cerebral volume loss and mild  periventricular white matter disease. Electronically signed by: Evalene Coho MD 12/27/2023 10:19 AM EST RP Workstation: [id number]   DG Chest Portable 1 View Result Date: 12/27/2023 CLINICAL DATA:  Falls. EXAM: PORTABLE CHEST 1 VIEW COMPARISON:  None Available. FINDINGS: Cardiac silhouette is normal in size and configuration. Normal mediastinal and hilar contours. Clear lungs.  No pleural effusion or pneumothorax. Skeletal structures are grossly intact. IMPRESSION: No active disease. Electronically Signed   By: Alm Parkins M.D.   On: 12/27/2023 10:02    PROCEDURES and INTERVENTIONS:  Procedures  Medications  lactated ringers  bolus 1,000 mL (0 mLs Intravenous Stopped 12/27/23 1153)  acetaminophen  (TYLENOL ) tablet 1,000 mg (1,000 mg Oral Given 12/27/23 0936)     IMPRESSION / MDM / ASSESSMENT AND PLAN / ED COURSE  I reviewed the triage vital signs and the nursing notes.  Differential diagnosis includes, but is not limited to, ICH, stroke, viral syndrome, sepsis  {Patient presents with symptoms of an acute illness or injury that is potentially life-threatening.  Patient presents  to the ED after unwitnessed fall at his facility.  Generally benign exam.  Workup with evidence of COVID-19 testing positive.  Otherwise reassuring blood work with normal CBC, metabolic panel, flat troponins.  UA without infectious features, reassuring imaging.  No indications for additional workup at this time.  Will discharge back to the facility and update them that he has tested positive for COVID  Clinical Course as of 12/27/23 1215  Thu Dec 27, 2023  1214 Reassessed and reexamined.  Reports feeling fine and has no complaints [DS]    Clinical Course User Index [DS] Claudene Rover, MD     FINAL CLINICAL IMPRESSION(S) / ED DIAGNOSES   Final diagnoses:  Fall, initial encounter  COVID-19     Rx / DC Orders   ED Discharge Orders     None        Note:  This document was prepared using  Dragon voice recognition software and may include unintentional dictation errors.   Claudene Rover, MD 12/27/23 [phone]

## 2023-12-27 NOTE — Discharge Instructions (Signed)
 Gabriel Wilson is testing positive for COVID-19 but otherwise a reassuring workup.  Use Tylenol  for pain and fevers.  Up to 1000 mg per dose, up to 4 times per day.  Do not take more than 4000 mg of Tylenol /acetaminophen  within 24 hours..  Return to the ED with any worsening symptoms

## 2024-01-16 ENCOUNTER — Other Ambulatory Visit: Payer: Self-pay

## 2024-01-16 ENCOUNTER — Telehealth: Payer: Self-pay

## 2024-01-16 DIAGNOSIS — R972 Elevated prostate specific antigen [PSA]: Secondary | ICD-10-CM

## 2024-01-16 NOTE — Telephone Encounter (Signed)
 Per Dr. Twylla, MD Patient is to be scheduled for Transrectal Ultrasound Guided Prostate Biopsy   [facility] facility was contacted and possible surgical dates were discussed, Friday January 2nd, 2026 was agreed upon for surgery. Patient was instructed that Dr. Twylla will require them to provide a pre-op UA & CX prior to surgery. This was ordered and sent to facility.They will obtain Urine tomorrow 01/17/2024.    Patient was directed to call [phone] between 1-3pm the day before surgery to find out surgical arrival time.  Instructions were given not to eat or drink from midnight on the night before surgery and have a driver for the day of surgery. On the surgery day patient was instructed to enter through the Medical Mall entrance of [facility] report the Same Day Surgery desk.   Pre-Admit Testing will be in contact via phone to set up an interview with the anesthesia team to review your history and medications prior to surgery.   Reminder of this information was sent via Fax to the facility.

## 2024-01-16 NOTE — Progress Notes (Signed)
° °  [facility] Surgical Posting Form  Surgery Date: Date: 02/01/2024  Surgeon: Dr. Glendia Barba, MD  Inpt ( No  )   Outpt (Yes)   Obs ( No  )   Diagnosis: R97.20 Elevated Prostate Specific Antigen  -CPT: 55700, [id number], [phone]  Surgery: Transrectal Ultrasound Guided Prostate Biopsy  Stop Anticoagulations: Yes, will need to hold Eliquis x 2 days  Cardiac/Medical/Pulmonary Clearance needed: no  *Orders entered into EPIC  Date: 01/16/2024   *Case booked in [place]  Date: 01/16/2024  *Notified pt of Surgery: Date: 01/16/2024  PRE-OP UA & CX: yes, will obtain at facility on 01/17/2024  *Placed into Prior Authorization Work [place] Date: 01/16/2024  Assistant/laser/rep:No

## 2024-01-16 NOTE — Progress Notes (Signed)
 Surgical Physician Order Form [facility]  Dr. Glendia Barba, MD  * Scheduling expectation : Next Available: Patient lives in a group home and legal guardian is DSS  *Length of Case: 30 min  *Clearance needed: Per anesthesia  *Anticoagulation Instructions: Has been on Eliquis and aspirin.  Apparently no longer taking but please verify  *Aspirin Instructions: As above  *Post-op visit Date/Instructions: TBD  *Diagnosis: Elevated PSA, abnormal prostate MRI  *Procedure:  Prostate Biopsy (55700) TRUS (23127) US  Hlpiz(23057)    Additional orders: N/A  -Admit type: OUTpatient  -Anesthesia: MAC  -VTE Prophylaxis Standing Order SCD's       Other:   -Standing Lab Orders Per Anesthesia    Lab other: UA&Urine Culture  -Standing Test orders EKG/Chest x-ray per Anesthesia       Test other:   - Medications:  Ceftriaxone(Rocephin) 1gm IV  -Other orders:  N/A

## 2024-01-16 NOTE — Telephone Encounter (Signed)
 Patient rescheduled.

## 2024-01-29 ENCOUNTER — Inpatient Hospital Stay: Admission: RE | Admit: 2024-01-29 | Source: Ambulatory Visit

## 2024-01-29 NOTE — Anesthesia Preprocedure Evaluation (Signed)
"                                    Anesthesia Evaluation  Patient identified by MRN, date of birth, ID band Patient confused  General Assessment Comment:Alert and in no acute distress. He is aware of name but does no know his exact location or the current date.   Reviewed: Allergy & Precautions, H&P , NPO status , Patient's Chart, lab work & pertinent test results  Airway Mallampati: II  TM Distance: >3 FB Neck ROM: full    Dental no notable dental hx.    Pulmonary Current Smoker   Pulmonary exam normal        Cardiovascular hypertension, Normal cardiovascular exam     Neuro/Psych  PSYCHIATRIC DISORDERS  Depression   Dementia negative neurological ROS     GI/Hepatic negative GI ROS, Neg liver ROS,,,  Endo/Other  negative endocrine ROS    Renal/GU negative Renal ROS  negative genitourinary   Musculoskeletal   Abdominal   Peds  Hematology negative hematology ROS (+)   Anesthesia Other Findings Past Medical History: No date: Dementia (HCC) No date: Depression No date: Elevated PSA No date: Forgetfulness No date: Hypertension No date: Impetigo No date: Neck pain No date: On apixaban therapy  Past Surgical History: No date: HERNIA REPAIR     Reproductive/Obstetrics negative OB ROS                              Anesthesia Physical Anesthesia Plan  ASA: 3  Anesthesia Plan: General   Post-op Pain Management: Minimal or no pain anticipated and Tylenol  PO (pre-op)*   Induction: Intravenous  PONV Risk Score and Plan: Propofol infusion and TIVA  Airway Management Planned: Natural Airway  Additional Equipment:   Intra-op Plan:   Post-operative Plan:   Informed Consent: I have reviewed the patients History and Physical, chart, labs and discussed the procedure including the risks, benefits and alternatives for the proposed anesthesia with the patient or authorized representative who has indicated his/her  understanding and acceptance.     Dental Advisory Given  Plan Discussed with: CRNA and Surgeon  Anesthesia Plan Comments:          Anesthesia Quick Evaluation  "

## 2024-01-30 ENCOUNTER — Inpatient Hospital Stay: Admission: RE | Admit: 2024-01-30 | Source: Ambulatory Visit

## 2024-01-31 DIAGNOSIS — I10 Essential (primary) hypertension: Secondary | ICD-10-CM | POA: Diagnosis not present

## 2024-01-31 DIAGNOSIS — C61 Malignant neoplasm of prostate: Secondary | ICD-10-CM | POA: Diagnosis not present

## 2024-01-31 DIAGNOSIS — R972 Elevated prostate specific antigen [PSA]: Secondary | ICD-10-CM | POA: Diagnosis present

## 2024-01-31 DIAGNOSIS — F17210 Nicotine dependence, cigarettes, uncomplicated: Secondary | ICD-10-CM | POA: Diagnosis not present

## 2024-01-31 DIAGNOSIS — Z7901 Long term (current) use of anticoagulants: Secondary | ICD-10-CM | POA: Diagnosis not present

## 2024-01-31 MED ORDER — SODIUM CHLORIDE 0.9 % IV SOLN
1.0000 g | INTRAVENOUS | Status: AC
  Administered 2024-02-01: 1 g via INTRAVENOUS
  Filled 2024-01-31: qty 10

## 2024-01-31 MED ORDER — ACETAMINOPHEN 500 MG PO TABS
1000.0000 mg | ORAL_TABLET | Freq: Once | ORAL | Status: AC
  Administered 2024-02-01: 1000 mg via ORAL

## 2024-01-31 MED ORDER — ORAL CARE MOUTH RINSE: 15.0000 mL | Freq: Once | OROMUCOSAL | Status: AC

## 2024-01-31 MED ORDER — FLEET ENEMA RE ENEM
1.0000 | ENEMA | Freq: Once | RECTAL | Status: AC
  Administered 2024-01-31: 1 via RECTAL

## 2024-01-31 MED ORDER — CHLORHEXIDINE GLUCONATE 0.12 % MT SOLN
15.0000 mL | Freq: Once | OROMUCOSAL | Status: AC
  Administered 2024-02-01: 15 mL via OROMUCOSAL

## 2024-01-31 MED ORDER — SODIUM CHLORIDE 0.9 % IV SOLN: INTRAVENOUS | Status: DC

## 2024-02-01 ENCOUNTER — Other Ambulatory Visit: Payer: Self-pay

## 2024-02-01 ENCOUNTER — Ambulatory Visit: Admission: RE | Admit: 2024-02-01 | Source: Ambulatory Visit

## 2024-02-01 ENCOUNTER — Ambulatory Visit
Admission: RE | Admit: 2024-02-01 | Discharge: 2024-02-01 | Disposition: A | Discharge status: Home / Self Care | Source: Ambulatory Visit | Attending: Urology | Admitting: Urology

## 2024-02-01 ENCOUNTER — Ambulatory Visit: Admitting: Anesthesiology

## 2024-02-01 ENCOUNTER — Encounter: Payer: Self-pay | Admitting: Urology

## 2024-02-01 ENCOUNTER — Encounter
Admission: RE | Disposition: A | Discharge status: Other Acute Inpatient Hospital | Payer: Self-pay | Source: Home / Self Care | Attending: Urology

## 2024-02-01 ENCOUNTER — Ambulatory Visit: Admission: RE | Admit: 2024-02-01 | Discharge: 2024-02-01 | Attending: Urology | Admitting: Urology

## 2024-02-01 DIAGNOSIS — R972 Elevated prostate specific antigen [PSA]: Secondary | ICD-10-CM

## 2024-02-01 DIAGNOSIS — Z7901 Long term (current) use of anticoagulants: Secondary | ICD-10-CM | POA: Insufficient documentation

## 2024-02-01 DIAGNOSIS — F17210 Nicotine dependence, cigarettes, uncomplicated: Secondary | ICD-10-CM | POA: Insufficient documentation

## 2024-02-01 DIAGNOSIS — I159 Secondary hypertension, unspecified: Secondary | ICD-10-CM

## 2024-02-01 DIAGNOSIS — C61 Malignant neoplasm of prostate: Secondary | ICD-10-CM | POA: Insufficient documentation

## 2024-02-01 DIAGNOSIS — I10 Essential (primary) hypertension: Secondary | ICD-10-CM | POA: Insufficient documentation

## 2024-02-01 HISTORY — PX: TRANSRECTAL ULTRASOUND: SHX5146

## 2024-02-01 HISTORY — PX: PROSTATE BIOPSY: SHX241

## 2024-02-01 LAB — PSA: Prostatic Specific Antigen: 12.1 ng/mL — ABNORMAL HIGH (ref 0.00–4.00)

## 2024-02-01 SURGERY — BIOPSY, PROSTATE
Anesthesia: General | Site: Prostate

## 2024-02-01 MED ORDER — FENTANYL CITRATE (PF) 100 MCG/2ML IJ SOLN
INTRAMUSCULAR | Status: DC | PRN
  Administered 2024-02-01: 25 ug via INTRAVENOUS

## 2024-02-01 MED ORDER — LIDOCAINE HCL (PF) 2 % IJ SOLN
INTRAMUSCULAR | Status: AC
  Filled 2024-02-01: qty 5

## 2024-02-01 MED ORDER — PROPOFOL 10 MG/ML IV BOLUS
INTRAVENOUS | Status: DC | PRN
  Administered 2024-02-01: 30 mg via INTRAVENOUS

## 2024-02-01 MED ORDER — ACETAMINOPHEN 500 MG PO TABS
ORAL_TABLET | ORAL | Status: AC
  Filled 2024-02-01: qty 2

## 2024-02-01 MED ORDER — PROPOFOL 10 MG/ML IV BOLUS
INTRAVENOUS | Status: AC
  Filled 2024-02-01: qty 20

## 2024-02-01 MED ORDER — CHLORHEXIDINE GLUCONATE 0.12 % MT SOLN
OROMUCOSAL | Status: AC
  Filled 2024-02-01: qty 15

## 2024-02-01 MED ORDER — LIDOCAINE HCL (PF) 2 % IJ SOLN
INTRAMUSCULAR | Status: DC | PRN
  Administered 2024-02-01: 40 mg via INTRADERMAL

## 2024-02-01 MED ORDER — PROPOFOL 500 MG/50ML IV EMUL
INTRAVENOUS | Status: DC | PRN
  Administered 2024-02-01: 100 ug/kg/min via INTRAVENOUS

## 2024-02-01 MED ORDER — FENTANYL CITRATE (PF) 100 MCG/2ML IJ SOLN
INTRAMUSCULAR | Status: AC
  Filled 2024-02-01: qty 2

## 2024-02-01 SURGICAL SUPPLY — 5 items
COVER MAYO STAND STRL (DRAPES) ×1 IMPLANT
GLOVE BIOGEL PI IND STRL 7.5 (GLOVE) ×1 IMPLANT
INST BIOPSY MAXCORE 18GX25 (NEEDLE) ×1 IMPLANT
PAD PREP 24X41 OB/GYN DISP (PERSONAL CARE ITEMS) ×1 IMPLANT
SURGILUBE 2OZ TUBE FLIPTOP (MISCELLANEOUS) ×1 IMPLANT

## 2024-02-01 NOTE — H&P (Signed)
 "  Urology H&P   Assessment/Recommendations:  1.  Elevated PSA Prostate MRI with PI-RADS 5 lesion bilateral posterior transition zone, posteromedial peripheral zone extending from base to apex and evidence of seminal vesicle invasion For TRUS/prostate biopsy.  The procedure has been discussed in detail including potential risks of bleeding and infection/sepsis.  All questions were answered.  His Eliquis has been held   History of Present Illness: Gabriel Wilson is a 74 y.o. with dementia currently residing in a memory care unit.  Apparent prior history of prostate cancer but no records available.  Patient does not remember any history of prostate cancer.  Prostate MRI with large PI-RADS 5 lesion.  Presents for TRUS/biopsy prostate.  Past Medical History:  Diagnosis Date   Dementia (HCC)    Depression    Elevated PSA    Forgetfulness    Hypertension    Impetigo    Neck pain    On apixaban therapy     Past Surgical History:  Procedure Laterality Date   HERNIA REPAIR      Home Medications:  Active Medications[1]  Allergies: Allergies[2]  Family History  Problem Relation Age of Onset   Cancer Mother        breast cancer   Cancer Father 21       prostate cancer    Social History:  reports that he has been smoking cigarettes. He has a 10 pack-year smoking history. He uses smokeless tobacco. He reports current alcohol use of about 1.0 standard drink of alcohol per week. He reports that he does not use drugs.  ROS: No fever, chills, chest pain, shortness of breath.  Physical Exam:  Vital signs in last 24 hours: Temp:  [96.9 F (36.1 C)] 96.9 F (36.1 C) (01/02 1048) Pulse Rate:  [63] 63 (01/02 1048) Resp:  [18] 18 (01/02 1048) BP: (160)/(94) 160/94 (01/02 1048) SpO2:  [100 %] 100 % (01/02 1048) Weight:  [99.8 kg] 99.8 kg (01/02 1048) Constitutional:  Alert and oriented, No acute distress HEENT: [place] AT, moist mucus membranes.  Trachea midline, no  masses Cardiovascular: Regular rate and rhythm, no clubbing, cyanosis, or edema. Respiratory: Normal respiratory effort, lungs clear bilaterally GI: Abdomen is soft, nontender, nondistended, no abdominal masses GU: No CVA tenderness Skin: No rashes, bruises or suspicious lesions Lymph: No cervical or inguinal adenopathy Neurologic: Grossly intact, no focal deficits, moving all 4 extremities Psychiatric: Normal mood and affect     02/01/2024, 11:33 AM  Glendia Barba,  MD        [1]  Current Meds  Medication Sig   acetaminophen  (TYLENOL ) 500 MG tablet Take 1,000 mg by mouth in the morning, at noon, and at bedtime.   apixaban (ELIQUIS) 5 MG TABS tablet Take 5 mg by mouth 2 (two) times daily.   cetirizine (ZYRTEC) 5 MG tablet Take 5 mg by mouth daily.   cyanocobalamin 1000 MCG tablet Take 1,000 mcg by mouth daily.   diclofenac Sodium (VOLTAREN) 1 % GEL Apply 2 g topically in the morning, at noon, and at bedtime. Apply to right shoulder for pain   diltiazem (CARDIZEM LA) 180 MG 24 hr tablet Take 180 mg by mouth daily.   donepezil (ARICEPT) 5 MG tablet Take 5 mg by mouth daily.   lisinopril (ZESTRIL) 20 MG tablet Take 20 mg by mouth daily.   Multiple Vitamins-Minerals (PRESERVISION AREDS 2 PO) Take 1 capsule by mouth in the morning and at bedtime.   risperiDONE (RISPERDAL) 1 MG tablet Take  1 mg by mouth in the morning.   Zinc Oxide 20 % PSTE Apply 1 Application topically 3 (three) times daily as needed (redness/irritation on buttocks).  [2]  Allergies Allergen Reactions   Celebrex [Celecoxib] Swelling   "

## 2024-02-01 NOTE — Transfer of Care (Signed)
 Immediate Anesthesia Transfer of Care Note  Patient: Gabriel Wilson  Procedure(s) Performed: BIOPSY, PROSTATE (Prostate) ULTRASOUND, RECTAL APPROACH (Prostate)  Patient Location: PACU  Anesthesia Type:General  Level of Consciousness: drowsy  Airway & Oxygen Therapy: Patient Spontanous Breathing and Patient connected to face mask oxygen  Post-op Assessment: Report given to RN and Post -op Vital signs reviewed and stable  Post vital signs: Reviewed and stable  Last Vitals:  Vitals Value Taken Time  BP 118/72 02/01/24 12:17  Temp    Pulse 55 02/01/24 12:19  Resp 13 02/01/24 12:19  SpO2 99 % 02/01/24 12:19  Vitals shown include unfiled device data.  Last Pain:  Vitals:   02/01/24 1048  PainSc: 0-No pain         Complications: No notable events documented.

## 2024-02-01 NOTE — Anesthesia Postprocedure Evaluation (Signed)
"   Anesthesia Post Note  Patient: Gabriel Wilson  Procedure(s) Performed: BIOPSY, PROSTATE (Prostate) ULTRASOUND, RECTAL APPROACH (Prostate)  Patient location during evaluation: PACU Anesthesia Type: General Level of consciousness: awake and alert Pain management: pain level controlled Vital Signs Assessment: post-procedure vital signs reviewed and stable Respiratory status: spontaneous breathing, nonlabored ventilation and respiratory function stable Cardiovascular status: blood pressure returned to baseline and stable Postop Assessment: no apparent nausea or vomiting Anesthetic complications: no   No notable events documented.   Last Vitals:  Vitals:   02/01/24 1240 02/01/24 1245  BP:  (!) 166/88  Pulse: 61 (!) 59  Resp: 11 14  Temp: 36.5 C   SpO2: 97% 97%    Last Pain:  Vitals:   02/01/24 1240  PainSc: 0-No pain                 Camellia Merilee Louder      "

## 2024-02-01 NOTE — Op Note (Signed)
" ° °  Preoperative diagnosis:  Elevated PSA  Postoperative diagnosis:  Elevated PSA  Procedure: Transrectal ultrasound prostate Transrectal prostate biopsies  Surgeon: [name], MD  Anesthesia: MAC  Complications: None  Intraoperative findings:  Prostate dimensions: 3.06 cm x 4.3 cm x 4.65 cm (H X W X L) Prostate volume: 32 cc  EBL: Minimal  Specimens: Standard 12 core biopsies  Indication: Gabriel Wilson is a 74 y.o. patient with an elevated PSA and prostate MRI showing a large PI-RADS 5 lesion.  Patient has dementia and a apparent history of prostate cancer but no records regarding any prior treatment.  After reviewing the management options for treatment, he elected to proceed with the above surgical procedure(s). We have discussed the potential benefits and risks of the procedure, side effects of the proposed treatment, the likelihood of the patient achieving the goals of the procedure, and any potential problems that might occur during the procedure or recuperation. Informed consent has been obtained.  Description of procedure:  The patient was taken to the operating room and was not transferred to the OR table.  He remained on the OR stretcher and was placed in the left lateral decubitus position.  IV sedation was obtained by anesthesia.  DRE was performed and the prostate was diffusely firm and indurated.  A transrectal ultrasound probe with needle guide was lubricated and gently inserted per rectum.  Prostate ultrasound was performed and transverse and sagittal planes with findings as described above.  Standard 12 core biopsies were then performed under ultrasound guidance.  No significant bleeding was noted.  He was then transferred to the PACU in stable condition.  Plan: His memory care facility will be contacted with the biopsy results   [name], M.D.  "

## 2024-02-01 NOTE — Interval H&P Note (Signed)
 History and Physical Interval Note:  02/01/2024 11:37 AM  Gabriel Wilson  has presented today for surgery, with the diagnosis of Elevated Prostate Specific Antigen.  The various methods of treatment have been discussed with the patient and family. After consideration of risks, benefits and other options for treatment, the patient has consented to  Procedures: BIOPSY, PROSTATE (N/A) ULTRASOUND, RECTAL APPROACH (N/A) as a surgical intervention.  The patient's history has been reviewed, patient examined, no change in status, stable for surgery.  I have reviewed the patient's chart and labs.  Questions were answered to the patient's satisfaction.    CV: RRR Lungs: Clear   Zan Triska C Camauri Fleece

## 2024-02-04 ENCOUNTER — Encounter: Payer: Self-pay | Admitting: Urology

## 2024-02-04 LAB — SURGICAL PATHOLOGY

## 2024-02-11 ENCOUNTER — Ambulatory Visit: Payer: Self-pay | Admitting: Urology

## 2024-02-15 ENCOUNTER — Telehealth: Payer: Self-pay | Admitting: Urology

## 2024-02-15 DIAGNOSIS — C61 Malignant neoplasm of prostate: Secondary | ICD-10-CM

## 2024-02-15 NOTE — Telephone Encounter (Signed)
 I spoke with Terrea Baron, legal guardian [facility] to discuss Mr. Campione prostate pathology report.  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  Recommended PSMA PET for staging prior to treatment recommendations

## 2024-03-03 ENCOUNTER — Ambulatory Visit: Admission: RE | Admit: 2024-03-03 | Source: Ambulatory Visit
